# Patient Record
Sex: Male | Born: 1960 | Race: White | Hispanic: No | Marital: Married | Smoking: Never smoker
Health system: Southern US, Community
[De-identification: ages and names within clinical notes are randomized; demographics above are authoritative.]

## PROBLEM LIST (undated history)

## (undated) DIAGNOSIS — M810 Age-related osteoporosis without current pathological fracture: Secondary | ICD-10-CM

## (undated) DIAGNOSIS — F32A Depression, unspecified: Secondary | ICD-10-CM

## (undated) DIAGNOSIS — R55 Syncope and collapse: Secondary | ICD-10-CM

## (undated) DIAGNOSIS — H409 Unspecified glaucoma: Secondary | ICD-10-CM

## (undated) DIAGNOSIS — F329 Major depressive disorder, single episode, unspecified: Secondary | ICD-10-CM

## (undated) DIAGNOSIS — G4733 Obstructive sleep apnea (adult) (pediatric): Secondary | ICD-10-CM

## (undated) DIAGNOSIS — G4730 Sleep apnea, unspecified: Secondary | ICD-10-CM

## (undated) DIAGNOSIS — I10 Essential (primary) hypertension: Secondary | ICD-10-CM

## (undated) DIAGNOSIS — N189 Chronic kidney disease, unspecified: Secondary | ICD-10-CM

## (undated) DIAGNOSIS — R112 Nausea with vomiting, unspecified: Secondary | ICD-10-CM

## (undated) DIAGNOSIS — Z9989 Dependence on other enabling machines and devices: Principal | ICD-10-CM

## (undated) DIAGNOSIS — C801 Malignant (primary) neoplasm, unspecified: Secondary | ICD-10-CM

## (undated) DIAGNOSIS — E785 Hyperlipidemia, unspecified: Secondary | ICD-10-CM

## (undated) DIAGNOSIS — E119 Type 2 diabetes mellitus without complications: Secondary | ICD-10-CM

## (undated) DIAGNOSIS — H3500 Unspecified background retinopathy: Secondary | ICD-10-CM

## (undated) DIAGNOSIS — Z9889 Other specified postprocedural states: Secondary | ICD-10-CM

## (undated) DIAGNOSIS — T7840XA Allergy, unspecified, initial encounter: Secondary | ICD-10-CM

## (undated) HISTORY — DX: Major depressive disorder, single episode, unspecified: F32.9

## (undated) HISTORY — PX: VITRECTOMY: SHX106

## (undated) HISTORY — DX: Type 2 diabetes mellitus without complications: E11.9

## (undated) HISTORY — PX: POLYPECTOMY: SHX149

## (undated) HISTORY — DX: Age-related osteoporosis without current pathological fracture: M81.0

## (undated) HISTORY — DX: Sleep apnea, unspecified: G47.30

## (undated) HISTORY — DX: Unspecified glaucoma: H40.9

## (undated) HISTORY — DX: Depression, unspecified: F32.A

## (undated) HISTORY — DX: Malignant (primary) neoplasm, unspecified: C80.1

## (undated) HISTORY — PX: HAND SURGERY: SHX662

## (undated) HISTORY — PX: EYE SURGERY: SHX253

## (undated) HISTORY — DX: Allergy, unspecified, initial encounter: T78.40XA

## (undated) HISTORY — DX: Chronic kidney disease, unspecified: N18.9

## (undated) HISTORY — DX: Hyperlipidemia, unspecified: E78.5

## (undated) HISTORY — DX: Dependence on other enabling machines and devices: Z99.89

## (undated) HISTORY — DX: Obstructive sleep apnea (adult) (pediatric): G47.33

## (undated) HISTORY — PX: COLONOSCOPY: SHX174

## (undated) HISTORY — PX: OTHER SURGICAL HISTORY: SHX169

---

## 1992-10-04 HISTORY — PX: CATARACT EXTRACTION: SUR2

## 1992-10-04 HISTORY — PX: DG BARIUM SWALLOW (ARMC HX): HXRAD1448

## 1998-04-11 ENCOUNTER — Emergency Department (HOSPITAL_COMMUNITY): Admission: EM | Admit: 1998-04-11 | Discharge: 1998-04-12 | Payer: Self-pay | Admitting: Emergency Medicine

## 1999-10-30 ENCOUNTER — Other Ambulatory Visit: Admission: RE | Admit: 1999-10-30 | Discharge: 1999-10-30 | Payer: Self-pay | Admitting: Urology

## 2004-02-10 ENCOUNTER — Ambulatory Visit (HOSPITAL_COMMUNITY): Admission: RE | Admit: 2004-02-10 | Discharge: 2004-02-10 | Payer: Self-pay | Admitting: Ophthalmology

## 2007-03-28 ENCOUNTER — Ambulatory Visit (HOSPITAL_COMMUNITY): Admission: RE | Admit: 2007-03-28 | Discharge: 2007-03-28 | Payer: Self-pay | Admitting: Otolaryngology

## 2008-07-05 IMAGING — CT CT ORBIT/TEMPORAL/IAC W/O CM
1 of 3 series · 9 of 30 positions shown, 12 images · IV contrast (agent unspecified)
Comparison: None.

CLINICAL DATA: Left ear infection.  Rule out osteomyelitis.  
 TEMPORAL BONES/ORBIT CT WITHOUT CONTRAST:
TECHNIQUE: Axial and coronal plane CT imaging of the petrous temporal bones was performed with thin-collimation image reconstruction.  No intravenous contrast was administered.

[Series 3: bilateral · axial · 0.37mm/px · z∈[-157,-100]mm · 9 of 119 slices shown, 12 images]
[im 12/119  brain]
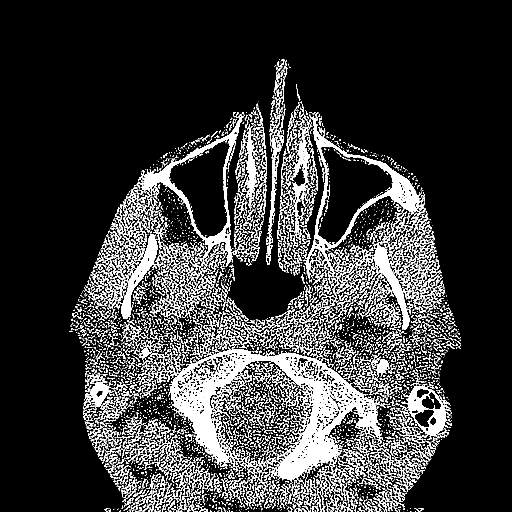
[im 12/119  bone]
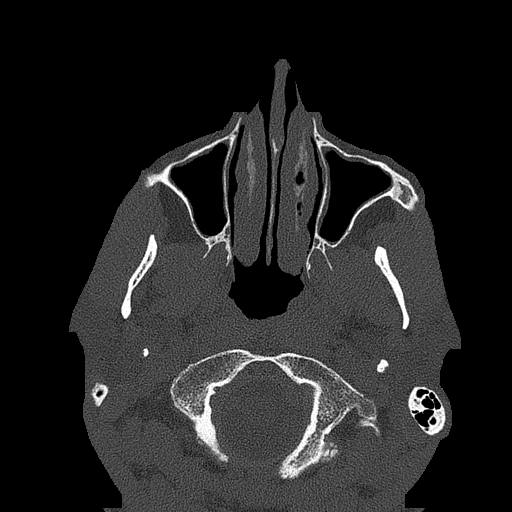
[im 24/119  bone]
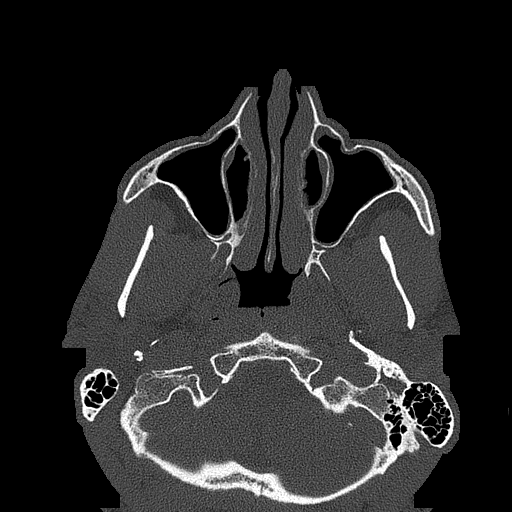
[im 36/119  bone]
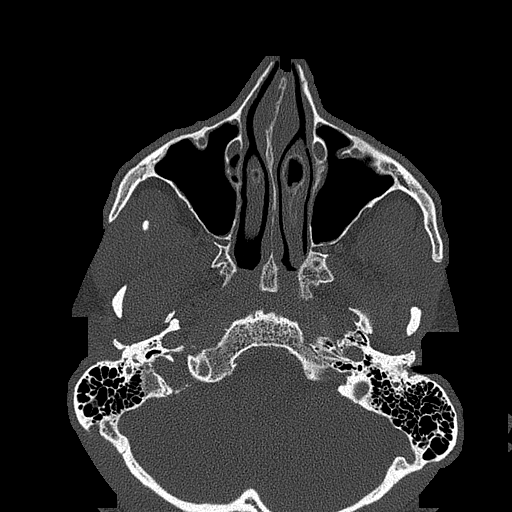
[im 48/119  bone]
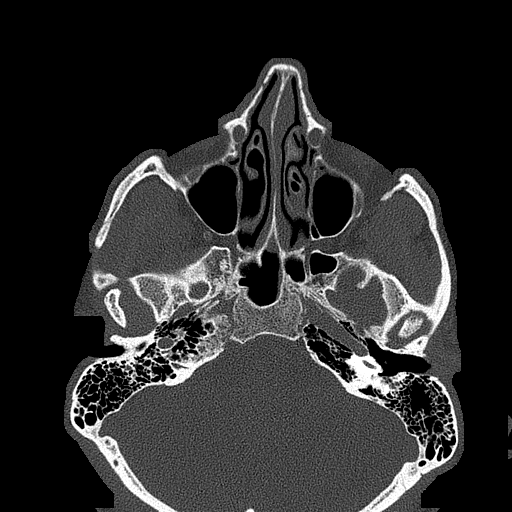
[im 60/119  brain]
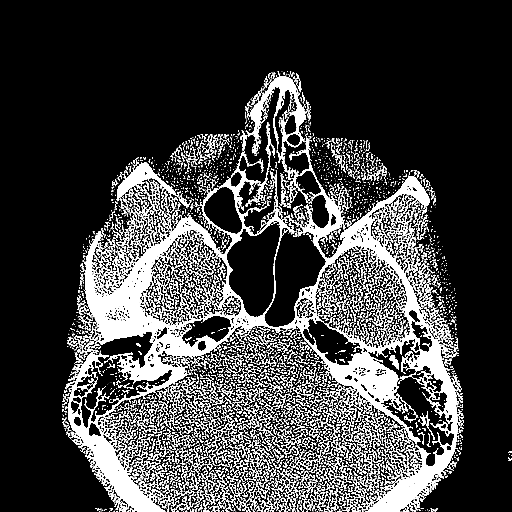
[im 60/119  bone]
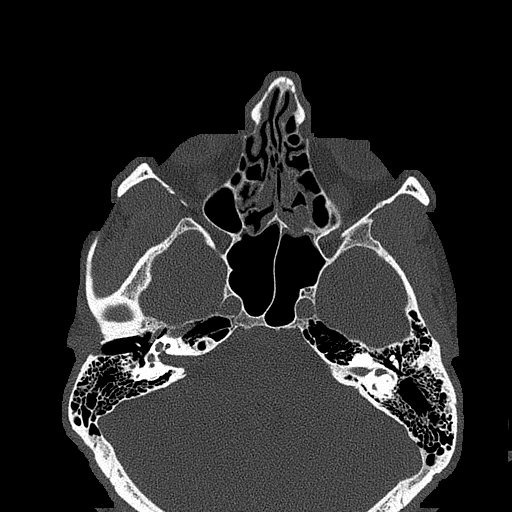
[im 71/119  bone]
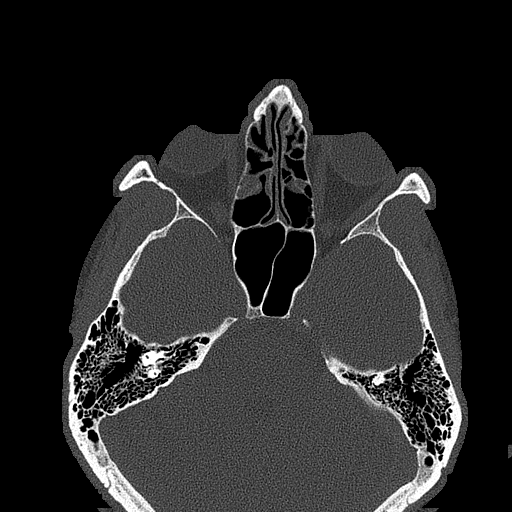
[im 83/119  bone]
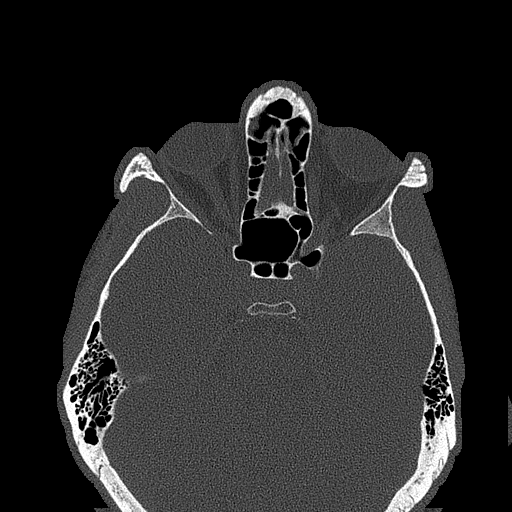
[im 95/119  bone]
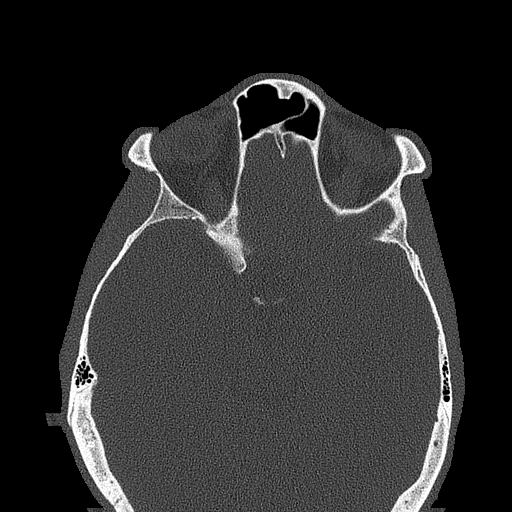
[im 107/119  brain]
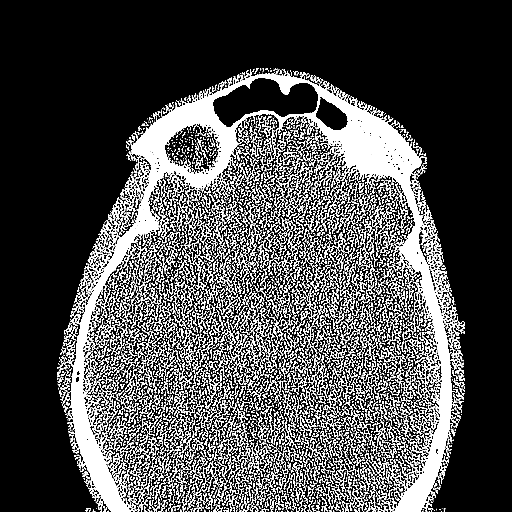
[im 107/119  bone]
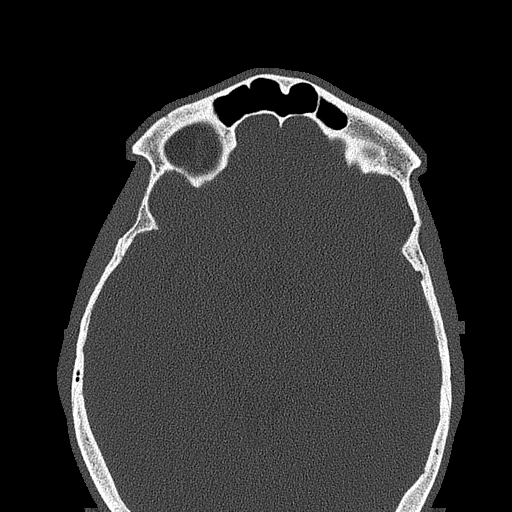

[9 of 30 positions shown; findings below may reference images not displayed]

FINDINGS: The temporal bones are normal bilaterally.  The middle ear is well aerated and the ossicles are normal.  The mastoid air cells are well developed and are clear.  There is no mass or cholesteatoma.  
 There is mild mucosal thickening in the paranasal sinuses without air fluid level.
IMPRESSION: 1. Normal temporal bones bilaterally. 
 2. Mild chronic sinusitis in the paranasal sinuses.

## 2012-10-13 ENCOUNTER — Encounter: Payer: Self-pay | Admitting: Internal Medicine

## 2012-10-30 ENCOUNTER — Ambulatory Visit (AMBULATORY_SURGERY_CENTER): Payer: BC Managed Care – PPO | Admitting: *Deleted

## 2012-10-30 ENCOUNTER — Telehealth: Payer: Self-pay | Admitting: *Deleted

## 2012-10-30 VITALS — Ht 71.0 in | Wt 188.6 lb

## 2012-10-30 DIAGNOSIS — Z1211 Encounter for screening for malignant neoplasm of colon: Secondary | ICD-10-CM

## 2012-10-30 MED ORDER — MOVIPREP 100 G PO SOLR: 1.0000 | Freq: Once | ORAL | Status: DC

## 2012-10-30 NOTE — Progress Notes (Signed)
Patient is on Insulin Pump, information sent to [facility], [place].

## 2012-10-30 NOTE — Telephone Encounter (Signed)
Patient is for direct screening colonoscopy and is on Insulin Pump managed by Dr.Aronson. Explained to patient that you will call him with insulin instructions. Thanks!

## 2012-10-31 ENCOUNTER — Encounter: Payer: Self-pay | Admitting: Internal Medicine

## 2012-10-31 NOTE — Telephone Encounter (Signed)
lvm for pt to call me back about his insulin pump. And instructions regarding his pump for his procedure.

## 2012-11-08 ENCOUNTER — Telehealth: Payer: Self-pay | Admitting: Gastroenterology

## 2012-11-08 ENCOUNTER — Other Ambulatory Visit: Payer: Self-pay | Admitting: Gastroenterology

## 2012-11-08 NOTE — Telephone Encounter (Signed)
Spoke to Diane Dr. Jacquiline Doe nurse, asked her for instructions for pt regarding his insulin pump for his procedure he has scheduled for next week.  She said she will call me back.

## 2012-11-08 NOTE — Telephone Encounter (Signed)
Dr. Jacquiline Doe office called back with insulin pump recommendations, I called pt to tell him per Dr. Reynaldo Minium: Back down on Basil's and No bolus' until he is back eating solid foods again. Pt. Stated understanding.

## 2012-11-15 ENCOUNTER — Other Ambulatory Visit: Payer: Self-pay | Admitting: Internal Medicine

## 2012-11-15 ENCOUNTER — Ambulatory Visit (AMBULATORY_SURGERY_CENTER): Payer: BC Managed Care – PPO | Admitting: Internal Medicine

## 2012-11-15 ENCOUNTER — Encounter: Payer: Self-pay | Admitting: Internal Medicine

## 2012-11-15 VITALS — BP 100/56 | HR 57 | Temp 96.0°F | Resp 19 | Ht 71.0 in | Wt 188.0 lb

## 2012-11-15 DIAGNOSIS — D126 Benign neoplasm of colon, unspecified: Secondary | ICD-10-CM

## 2012-11-15 DIAGNOSIS — Z1211 Encounter for screening for malignant neoplasm of colon: Secondary | ICD-10-CM

## 2012-11-15 MED ORDER — DEXTROSE 5 % IV SOLN: INTRAVENOUS | Status: DC

## 2012-11-15 NOTE — Progress Notes (Signed)
Pt stable to RR 

## 2012-11-15 NOTE — Op Note (Addendum)
[facility]  [address] [place], [address]   COLONOSCOPY PROCEDURE REPORT  PATIENT: Dillon Lane, Dillon Lane.  MR#: [id number] BIRTHDATE: [birth year] , 51  yrs. old GENDER: Male ENDOSCOPIST: Jerene Bears, MD REFERRED [id number], Richard PROCEDURE DATE:  11/15/2012 PROCEDURE:   Colonoscopy with snare polypectomy ASA CLASS:   Class II INDICATIONS:average risk screening and first colonoscopy. MEDICATIONS: MAC sedation, administered by CRNA and propofol (Diprivan) 200mg  IV  DESCRIPTION OF PROCEDURE:   After the risks benefits and alternatives of the procedure were thoroughly explained, informed consent was obtained.  A digital rectal exam revealed no rectal mass.   The LB [id number]-[id number]  endoscope was introduced through the anus and advanced to the cecum, which was identified by both the appendix and ileocecal valve. No adverse events experienced. The quality of the prep was good, using MoviPrep  The instrument was then slowly withdrawn as the colon was fully examined.   COLON FINDINGS: Two sessile polyps measuring 4-5 mm in size were found in the transverse colon and sigmoid colon.  Polypectomy was performed using cold snare.  All resections were complete and all polyp tissue was completely retrieved.   Small internal hemorrhoids were found.  Retroflexed views revealed internal hemorrhoids. The time to cecum=3 minutes 11 seconds.  Withdrawal time=14 minutes 04 seconds.  The scope was withdrawn and the procedure completed. COMPLICATIONS: There were no complications.  ENDOSCOPIC IMPRESSION: 1.   Two sessile polyps measuring 4-5 mm in size were found in the transverse colon and sigmoid colon; Polypectomy was performed using cold snare 2.   Small internal hemorrhoids  RECOMMENDATIONS: 1.  Hold aspirin, aspirin products, and anti-inflammatory medication for 1 week. 2.  Await pathology results 3.  If the polyps removed today are proven to be  adenomatous (pre-cancerous) polyps, you will need a repeat colonoscopy in 5 years.  Otherwise you should continue to follow colorectal cancer screening guidelines for "routine risk" patients with colonoscopy in 10 years.  You will receive a letter within 1-2 weeks with the results of your biopsy as well as final recommendations.  Please call my office if you have not received a letter after 3 weeks.   eSigned:  Jerene Bears, MD 11/15/2012 10:25 AM  cc: The Patient and Burnard Bunting, MD

## 2012-11-15 NOTE — Progress Notes (Signed)
Called to room to assist during endoscopic procedure.  Patient ID and intended procedure confirmed with present staff. Received instructions for my participation in the procedure from the performing physician.  

## 2012-11-15 NOTE — Patient Instructions (Addendum)
Findings:  Polyps, Small Internal Hemorrhoids Recommendations:  HOLD ASPIRIN, ASPIRIN PRODUCTS, AND ANTI-INFLAMMATORY MEDICATIONS FOR ONE WEEK.   YOU HAD AN ENDOSCOPIC PROCEDURE TODAY AT [facility]: Refer to the procedure report that was given to you for any specific questions about what was found during the examination.  If the procedure report does not answer your questions, please call your gastroenterologist to clarify.  If you requested that your care partner not be given the details of your procedure findings, then the procedure report has been included in a sealed envelope for you to review at your convenience later.  YOU SHOULD EXPECT: Some feelings of bloating in the abdomen. Passage of more gas than usual.  Walking can help get rid of the air that was put into your GI tract during the procedure and reduce the bloating. If you had a lower endoscopy (such as a colonoscopy or flexible sigmoidoscopy) you may notice spotting of blood in your stool or on the toilet paper. If you underwent a bowel prep for your procedure, then you may not have a normal bowel movement for a few days.  DIET: Your first meal following the procedure should be a light meal and then it is ok to progress to your normal diet.  A half-sandwich or bowl of soup is an example of a good first meal.  Heavy or fried foods are harder to digest and may make you feel nauseous or bloated.  Likewise meals heavy in dairy and vegetables can cause extra gas to form and this can also increase the bloating.  Drink plenty of fluids but you should avoid alcoholic beverages for 24 hours.  ACTIVITY: Your care partner should take you home directly after the procedure.  You should plan to take it easy, moving slowly for the rest of the day.  You can resume normal activity the day after the procedure however you should NOT DRIVE or use heavy machinery for 24 hours (because of the sedation medicines used during the test).     SYMPTOMS TO REPORT IMMEDIATELY: A gastroenterologist can be reached at any hour.  During normal business hours, 8:30 AM to 5:00 PM Monday through Friday, call [phone].  After hours and on weekends, please call the GI answering service at [phone] who will take a message and have the physician on call contact you.   Following lower endoscopy (colonoscopy or flexible sigmoidoscopy):  Excessive amounts of blood in the stool  Significant tenderness or worsening of abdominal pains  Swelling of the abdomen that is new, acute  Fever of 100F or higher  Following upper endoscopy (EGD)  Vomiting of blood or coffee ground material  New chest pain or pain under the shoulder blades  Painful or persistently difficult swallowing  New shortness of breath  Fever of 100F or higher  Black, tarry-looking stools  FOLLOW UP: If any biopsies were taken you will be contacted by phone or by letter within the next 1-3 weeks.  Call your gastroenterologist if you have not heard about the biopsies in 3 weeks.  Our staff will call the home number listed on your records the next business day following your procedure to check on you and address any questions or concerns that you may have at that time regarding the information given to you following your procedure. This is a courtesy call and so if there is no answer at the home number and we have not heard from you through the emergency physician on call,  we will assume that you have returned to your regular daily activities without incident.  SIGNATURES/CONFIDENTIALITY: You and/or your care partner have signed paperwork which will be entered into your electronic medical record.  These signatures attest to the fact that that the information above on your After Visit Summary has been reviewed and is understood.  Full responsibility of the confidentiality of this discharge information lies with you and/or your care-partner.  Please follow all discharge  instructions given to you by the recovery room nurse. If you have any questions or problems after discharge please call one of the numbers listed above. You will receive a phone call in the am to see how you are doing and answer any questions you may have. Thank you for choosing [facility] for your health care needs.

## 2012-11-20 ENCOUNTER — Telehealth: Payer: Self-pay | Admitting: *Deleted

## 2012-11-20 NOTE — Telephone Encounter (Signed)
  Follow up Call-  Call back number 11/15/2012  Post procedure Call Back phone  # [phone]  Permission to leave phone message Yes     Patient questions:  Do you have a fever, pain , or abdominal swelling? no Pain Score  0 *  Have you tolerated food without any problems? yes  Have you been able to return to your normal activities? yes  Do you have any questions about your discharge instructions: Diet  yes Medications  no Follow up visit  no  Do you have questions or concerns about your Care? no  Actions: * If pain score is 4 or above: No action needed, pain <4. Pt. States he has lots of loose stools.  He wondered about adding fiber to his diet.  Advised to start slowly and aim for 35-45 grams daily.  He was also encouraged to drink fluids to flush  Bowel.

## 2012-11-21 ENCOUNTER — Encounter: Payer: Self-pay | Admitting: Internal Medicine

## 2016-07-07 ENCOUNTER — Ambulatory Visit (INDEPENDENT_AMBULATORY_CARE_PROVIDER_SITE_OTHER): Payer: Medicare Other | Admitting: Neurology

## 2016-07-07 ENCOUNTER — Encounter: Payer: Self-pay | Admitting: Neurology

## 2016-07-07 VITALS — BP 142/70 | HR 72 | Resp 16 | Ht 72.0 in | Wt 188.0 lb

## 2016-07-07 DIAGNOSIS — G4719 Other hypersomnia: Secondary | ICD-10-CM | POA: Diagnosis not present

## 2016-07-07 DIAGNOSIS — R351 Nocturia: Secondary | ICD-10-CM | POA: Diagnosis not present

## 2016-07-07 DIAGNOSIS — R0683 Snoring: Secondary | ICD-10-CM

## 2016-07-07 DIAGNOSIS — R0681 Apnea, not elsewhere classified: Secondary | ICD-10-CM

## 2016-07-07 NOTE — Progress Notes (Signed)
Subjective:    Patient ID: Dillon Lane is a 55 y.o. male.  HPI     Star Age, MD, PhD [facility] [address], [address] [place], [place] [address]  Dear Dr. Reynaldo Minium,   I saw your patient, Dillon Lane, upon your kind request in my neurologic clinic today for initial consultation of his sleep disorder, in particular, concern for underlying obstructive sleep apnea. The patient is accompanied by his wife today. As you know, [name] is a 55 year old right-handed gentleman with an underlying medical history of type 1 diabetes (diagnosed at 35) with diabetic retinopathy, nephropathy and neuropathy, history of Sz in the context of hypoglycemia in 8/16, history of IBS, hearing loss, hypertension, open angle glaucoma, and overweight state, who reports snoring and excessive daytime somnolence. His wife has occasionally noted pauses in his breathing while he is asleep. I reviewed your office note from 07/01/2016, which you kindly included. He endorses recent stressors. His wife retired and since then he has been on [employer], but found out recently that a lot of his diabetes supplies are not covered on his new insurance. This has caused him stress and anxiety. He also endorses depression. He has recently seen a counselor, which he found helpful. He is on disability. He does not smoke or drink alcohol but drinks unsweetened iced tea him a several cups per day, about 3-4 at least. Bedtime varies. He admits to not having a set routine. He is typically in bed after 11:30 PM. He does not typically nap. He has some trouble going to sleep and staying asleep. His mind is racing at times. He does get sleepy when sedentary. Epworth sleepiness score is 9 out of 24 today, fatigue score is 30 out of 63. He does not have a family history of obstructive sleep apnea. He denies restless leg symptoms.  His Past Medical History Is Significant For: Past Medical History:  Diagnosis Date   . Chronic kidney disease    stage 2  . Diabetes mellitus without complication ([place])    DM type 1, on insulin pump.  . Glaucoma     His Past Surgical History Is Significant For: Past Surgical History:  Procedure Laterality Date  . CATARACT EXTRACTION  1994   left eye  . EYE SURGERY     x3 with 9 sessions of lasers for retinopathy.  [name] HAND SURGERY  2 years ago   x2    His Family History Is Significant For: Family History  Problem Relation Age of Onset  . Brain cancer Mother   . Prostate cancer Father   . Colon cancer Neg Hx   . Colon polyps Neg Hx   . Rectal cancer Neg Hx   . Stomach cancer Neg Hx     His Social History Is Significant For: Social History   Social History  . Marital status: Married    Spouse name: N/A  . Number of children: 2  . Years of education: HS   Occupational History  . Disability     Social History Main Topics  . Smoking status: Never Smoker  . Smokeless tobacco: Never Used  . Alcohol use No  . Drug use: No  . Sexual activity: Not Asked   Other Topics Concern  . None   Social History Narrative   Denies caffeine use     His Allergies Are:  Allergies  Allergen Reactions  . Tetracyclines & Related Shortness Of Breath  :   His  Current Medications Are:  Outpatient Encounter Prescriptions as of 07/07/2016  Medication Sig  . bimatoprost (LUMIGAN) 0.01 % SOLN Place 1 drop into both eyes at bedtime.  . cetirizine (ZYRTEC) 10 MG tablet Take 10 mg by mouth daily.  . Cholecalciferol (VITAMIN D3) 1000 UNITS CAPS Take 1 capsule by mouth daily.  . dorzolamide (TRUSOPT) 2 % ophthalmic solution Place 1 drop into both eyes 2 (two) times daily.  [name] HUMALOG 100 UNIT/ML injection USE AS DIRECTED IN INSULIN PUMP, MAX 66 UNITS/DAY  . lisinopril-hydrochlorothiazide (PRINZIDE,ZESTORETIC) 20-12.5 MG per tablet Take 1 tablet by mouth 2 (two) times daily.  . timolol (TIMOPTIC) 0.5 % ophthalmic solution INSTILL 1 DROP INTO BOTH EYES 2 TIMES DAILY.  .  [DISCONTINUED] brimonidine-timolol (COMBIGAN) 0.2-0.5 % ophthalmic solution Place 1 drop into the right eye every 12 (twelve) hours.  . [DISCONTINUED] cefdinir (OMNICEF) 300 MG capsule Take 300 mg by mouth 2 (two) times daily.  . [DISCONTINUED] Insulin Human (INSULIN PUMP) 100 unit/ml SOLN Inject 1 each into the skin continuous.  . [DISCONTINUED] Loperamide HCl (IMODIUM A-D PO) Take 1 mg by mouth as needed.   No facility-administered encounter medications on file as of 07/07/2016.   :  Review of Systems:  Out of a complete 14 point review of systems, all are reviewed and negative with the exception of these symptoms as listed below: Review of Systems  Allergic/Immunologic: Positive for environmental allergies.  Neurological:       Patient feels that he is a light sleeper, snoring, witnessed apnea, wakes up feeling tired, may fall asleep when sitting still, denies taking naps.  Patient reports feeling depressed currently.   Psychiatric/Behavioral:       Disinterest in activities     Epworth Sleepiness Scale 0= would never doze 1= slight chance of dozing 2= moderate chance of dozing 3= high chance of dozing  Sitting and reading:2 Watching TV:2 Sitting inactive in a public place (ex. Theater or meeting):2 As a passenger in a car for an hour without a break:2 Lying down to rest in the afternoon:0 Sitting and talking to someone:1 Sitting quietly after lunch (no alcohol):0 In a car, while stopped in traffic:0 Total:9  Objective:  Neurologic Exam  Physical Exam Physical Examination:   Vitals:   07/07/16 1559  BP: (!) 142/70  Pulse: 72  Resp: 16    General Examination: The patient is a very pleasant 55 y.o. male in no acute distress. He appears well-developed and well-nourished and well groomed.   HEENT: Normocephalic, atraumatic, pupils are unequal, round and sluggish reactive to light and accommodation. He is status post vitrectomy 3 he states. He has had cataract repairs  bilaterally and is wearing a contact lens on the right eye. Funduscopic exam with sharp disc margins noted. Extraocular tracking is good without limitation to gaze excursion or nystagmus noted. Normal smooth pursuit is noted. Hearing is grossly intact. Face is symmetric with normal facial animation and normal facial sensation. Speech is clear with no dysarthria noted. There is no hypophonia. There is no lip, neck/head, jaw or voice tremor. Neck is supple with full range of passive and active motion. There are no carotid bruits on auscultation. Oropharynx exam reveals: mild mouth dryness, adequate dental hygiene and moderate airway crowding, due to larger tonsils of 2-3+ bilaterally, larger uvula, smaller airway entry. Mallampati is class II. Neck circumference is 16-3/8 in.    Chest: Clear to auscultation without wheezing, rhonchi or crackles noted.  Heart: S1+S2+0, regular and normal without murmurs, rubs  or gallops noted.   Abdomen: Soft, non-tender and non-distended with normal bowel sounds appreciated on auscultation. Glucose monitor and pump in place.  Extremities: There is no pitting edema in the distal lower extremities bilaterally. Pedal pulses are intact.  Skin: Warm and dry without trophic changes noted. There are no varicose veins.  Musculoskeletal: exam reveals no obvious joint deformities, tenderness or joint swelling or erythema.   Neurologically:  Mental status: The patient is awake, alert and oriented in all 4 spheres. His immediate and remote memory, attention, language skills and fund of knowledge are appropriate. There is no evidence of aphasia, agnosia, apraxia or anomia. Speech is clear with normal prosody and enunciation. Thought process is linear. Mood is normal and affect is blunted.  Cranial nerves II - XII are as described above under HEENT exam. In addition: shoulder shrug is normal with equal shoulder height noted. Motor exam: Normal bulk, strength and tone is noted.  There is no drift, tremor or rebound. Romberg is negative. Reflexes are 1+ in the upper extremities and diminished in the lower extremities. Babinski: Toes are flexor bilaterally. Fine motor skills and coordination: intact with normal finger taps, normal hand movements, normal rapid alternating patting, normal foot taps and normal foot agility.  Cerebellar testing: No dysmetria or intention tremor on finger to nose testing. Heel to shin is unremarkable bilaterally. There is no truncal or gait ataxia.  Sensory exam: intact to light touch, pinprick, vibration, temperature sense in the upper extremities, but decrease to all modalities in the distal lower extremities, up to above ankles bilaterally.   Gait, station and balance: He stands easily. No veering to one side is noted. No leaning to one side is noted. Posture is age-appropriate and stance is narrow based. Gait shows normal stride length and normal pace. No problems turning are noted.                Assessment and Plan:   In summary, EFOSA TREICHLER is a very pleasant 55 y.o.-year old male with an underlying medical history of type 1 diabetes (diagnosed at 55) with diabetic retinopathy, nephropathy and neuropathy, history of Sz in the context of hypoglycemia in 8/16, history of IBS, hearing loss, hypertension, open angle glaucoma, and overweight state, whose history and physical exam are concerning for obstructive sleep apnea (OSA). I had a long chat with the patient and his wife about my findings and the diagnosis of OSA, its prognosis and treatment options. We talked about medical treatments, surgical interventions and non-pharmacological approaches. I explained in particular the risks and ramifications of untreated moderate to severe OSA, especially with respect to developing cardiovascular disease down the Road, including congestive heart failure, difficult to treat hypertension, cardiac arrhythmias, or stroke. Even type 2 diabetes has, in part, been  linked to untreated OSA. Symptoms of untreated OSA include daytime sleepiness, memory problems, mood irritability and mood disorder such as depression and anxiety, lack of energy, as well as recurrent headaches, especially morning headaches. We talked about trying to maintain a healthy lifestyle in general, as well as the importance of weight control. I encouraged the patient to eat healthy, exercise daily and keep well hydrated, to keep a scheduled bedtime and wake time routine, to not skip any meals and eat healthy snacks in between meals. I advised the patient not to drive when feeling sleepy. He is advised to gradually reduce his T intake and increase his water intake. He is also advised to avoid caffeine after 5 PM, better  3 PM. I recommended the following at this time: sleep study with potential positive airway pressure titration. (We will score hypopneas at 4% and split the sleep study into diagnostic and treatment portion, if the estimated. 2 hour AHI is >20/h).   I explained the sleep test procedure to the patient and also outlined possible surgical and non-surgical treatment options of OSA, including the use of a custom-made dental device (which would require a referral to a specialist dentist or oral surgeon), upper airway surgical options, such as pillar implants, radiofrequency surgery, tongue base surgery, and UPPP (which would involve a referral to an ENT surgeon). Rarely, jaw surgery such as mandibular advancement may be considered.  I also explained the CPAP treatment option to the patient, who indicated that he would be willing to try CPAP if the need arises. I explained the importance of being compliant with PAP treatment, not only for insurance purposes but primarily to improve His symptoms, and for the patient's long term health benefit, including to reduce His cardiovascular risks. I answered all their questions today and the patient and his wife were in agreement. I would like to see him  back after the sleep study is completed and encouraged him to call with any interim questions, concerns, problems or updates.   Thank you very much for allowing me to participate in the care of this nice patient. If I can be of any further assistance to you please do not hesitate to call me at [phone].  Sincerely,   Star Age, MD, PhD

## 2016-07-07 NOTE — Patient Instructions (Addendum)
Based on your symptoms and your exam I believe you are at risk for obstructive sleep apnea or OSA, and I think we should proceed with a sleep study to determine whether you do or do not have OSA and how severe it is. If you have more than mild OSA, I want you to consider treatment with CPAP. Please remember, the risks and ramifications of moderate to severe obstructive sleep apnea or OSA are: Cardiovascular disease, including congestive heart failure, stroke, difficult to control hypertension, arrhythmias, and even type 2 diabetes has been linked to untreated OSA. Sleep apnea causes disruption of sleep and sleep deprivation in most cases, which, in turn, can cause recurrent headaches, problems with memory, mood, concentration, focus, and vigilance. Most people with untreated sleep apnea report excessive daytime sleepiness, which can affect their ability to drive. Please do not drive if you feel sleepy.   I will likely see you back after your sleep study to go over the test results and where to go from there. We will call you after your sleep study to advise about the results (most likely, you will hear from Beverlee Nims, my nurse) and to set up an appointment at the time, as necessary.    Our sleep lab administrative assistant, Arrie Aran will meet with you or call you to schedule your sleep study. If you don't hear back from her by next week please feel free to call her at [phone]. This is her direct line and please leave a message with your phone number to call back if you get the voicemail box. She will call back as soon as possible.   Please reduce your tea consumption, avoid caffeine after 3 PM, at least after 5 PM.

## 2016-09-06 ENCOUNTER — Ambulatory Visit (INDEPENDENT_AMBULATORY_CARE_PROVIDER_SITE_OTHER): Payer: Medicare Other | Admitting: Neurology

## 2016-09-06 DIAGNOSIS — G4733 Obstructive sleep apnea (adult) (pediatric): Secondary | ICD-10-CM | POA: Diagnosis not present

## 2016-09-06 DIAGNOSIS — G4720 Circadian rhythm sleep disorder, unspecified type: Secondary | ICD-10-CM

## 2016-09-15 ENCOUNTER — Telehealth: Payer: Self-pay

## 2016-09-15 NOTE — Progress Notes (Signed)
Diana:  Patient referred by Dr. Reynaldo Minium, seen by me on 07/07/16, split study on 09/06/16. Please call and notify patient that the recent sleep study confirmed the diagnosis of severe OSA. He did very well with CPAP during the study with significant improvement of the respiratory events. Therefore, I would like start the patient on CPAP therapy at home by prescribing a machine for home use. I placed the order in the chart. The patient will need a follow up appointment with me in 8 to 10 weeks post set up that has to be scheduled; please go ahead and schedule while you have the patient on the phone and make sure patient understands the importance of keeping this window for the FU appointment, as it is often an insurance requirement and failing to adhere to this may result in losing coverage for sleep apnea treatment.  Please re-enforce the importance of compliance with treatment and the need for [place] to monitor compliance data - again an insurance requirement and good feedback for the patient as far as how they are doing.  Also remind patient, that any upcoming CPAP machine or mask issues, should be first addressed with the DME company. Please ask if patient has a preference regarding DME company.  Please arrange for CPAP set up at home through a DME company of patient's choice - once you have spoken to the patient - and faxed/routed report to PCP and referring MD (if other than PCP), you can close this encounter, thanks,   Star Age, MD, PhD [facility] ([place])

## 2016-09-15 NOTE — Addendum Note (Signed)
Addended by: Star Age on: 09/15/2016 08:13 AM   Modules accepted: Orders

## 2016-09-15 NOTE — Telephone Encounter (Signed)
I had an extended conversation with pt regarding his sleep study results. I explained that the pt has severe sleep apnea and Dr. Rexene Alberts recommends starting a cpap. Pt had many questions regarding untreated sleep apnea risks, the insurance process for getting a cpap, cpap requirements, etc. I answered all of his questions. I offered pt a follow up appt with Dr. Rexene Alberts to discuss further but pt declined. Pt says that he promises to call me back next week to discuss. He wants to do his research and discuss it with his wife. Pt asked me what days that I am working next week and says he will call me. I advised pt that I would call him next Thursday if I have not heard from him by then. Pt verbalized understanding of results.

## 2016-09-15 NOTE — Telephone Encounter (Signed)
-----   Message from Star Age, MD sent at 09/15/2016  8:13 AM EST ----- Beverlee Nims:  Patient referred by Dr. Reynaldo Minium, seen by me on 07/07/16, split study on 09/06/16. Please call and notify patient that the recent sleep study confirmed the diagnosis of severe OSA. He did very well with CPAP during the study with significant improvement of the respiratory events. Therefore, I would like start the patient on CPAP therapy at home by prescribing a machine for home use. I placed the order in the chart. The patient will need a follow up appointment with me in 8 to 10 weeks post set up that has to be scheduled; please go ahead and schedule while you have the patient on the phone and make sure patient understands the importance of keeping this window for the FU appointment, as it is often an insurance requirement and failing to adhere to this may result in losing coverage for sleep apnea treatment.  Please re-enforce the importance of compliance with treatment and the need for [place] to monitor compliance data - again an insurance requirement and good feedback for the patient as far as how they are doing.  Also remind patient, that any upcoming CPAP machine or mask issues, should be first addressed with the DME company. Please ask if patient has a preference regarding DME company.  Please arrange for CPAP set up at home through a DME company of patient's choice - once you have spoken to the patient - and faxed/routed report to PCP and referring MD (if other than PCP), you can close this encounter, thanks,   Star Age, MD, PhD [facility] ([place])

## 2016-09-15 NOTE — Procedures (Signed)
PATIENT'S NAME:  Dillon Lane, Dillon Lane DOB:      October 13, 1960      MR#:    [id number]     DATE OF RECORDING: 09/06/2016 REFERRING M.D.:  Burnard Bunting, MD Study Performed:  Split-Night Titration Study HISTORY: 55 year old man with a history of type 1 diabetes, diabetic retinopathy, nephropathy and neuropathy, history of Sz in the context of hypoglycemia, IBS, hearing loss, hypertension, open angle glaucoma, and overweight state, who reports snoring and excessive daytime somnolence. His wife has occasionally noted pauses in his breathing while he is asleep. The patient endorsed the Epworth Sleepiness Scale at 9/24 points. The patient's weight 188 pounds with a height of 72 (inches), resulting in a BMI of 25.4 kg/m2. The patient's neck circumference measured 16 inches.  CURRENT MEDICATIONS: Lumigan, Zyrtec, Vitamin D, Trusopt, Humalog, Prinizide, Timoptic.    PROCEDURE:  This is a multichannel digital polysomnogram utilizing the Somnostar 11.2 system.  Electrodes and sensors were applied and monitored per AASM Specifications.   EEG, EOG, Chin and Limb EMG, were sampled at 200 Hz.  ECG, Snore and Nasal Pressure, Thermal Airflow, Respiratory Effort, CPAP Flow and Pressure, Oximetry was sampled at 50 Hz. Digital video and audio were recorded.      BASELINE STUDY WITHOUT CPAP RESULTS:  Lights Out was at 22:10 and Lights On at 05:20 for the night.  Total recording time (TRT) was 164.5, with a total sleep time (TST) of 146.5 minutes.   The patient's sleep latency was 11 minutes.  REM latency was 0 minutes.  The sleep efficiency was 89.1 %.    SLEEP ARCHITECTURE: WASO (Wake after sleep onset) was 9 minutes, Stage N1 was 13 minutes, Stage N2 was 133.5 minutes, Stage N3 was 0 minutes and Stage R (REM sleep) was 0 minutes.  The percentages were Stage N1 8.9%, Stage N2 91.1%, Stage N3 and Stage R were absent.   Audio and video analysis did not show any abnormal or unusual movements, behaviors, phonations or  vocalizations.  The patient took 1 bathroom break. Moderate to loud snoring was noted. The EKG was in keeping with normal sinus rhythm (NSR)  RESPIRATORY ANALYSIS:  There were a total of 111 respiratory events:  85 obstructive apneas, 0 central apneas and 0 mixed apneas with a total of 85 apneas and an apnea index (AI) of 34.8. There were 26 hypopneas with a hypopnea index of 10.6. The patient also had 0 respiratory event related arousals (RERAs).  Snoring was noted.     The total APNEA/HYPOPNEA INDEX (AHI) was 45.5 /hour and the total RESPIRATORY DISTURBANCE INDEX was 45.5 /hour.  0 events occurred in REM sleep and 52 events in NREM. The REM AHI was 0, /hour versus a non-REM AHI of 45.5 /hour. The patient spent 401.5 minutes sleep time in the supine position 0 minutes in non-supine. The supine AHI was 45.4 /hour versus a non-supine AHI of 0.0 /hour.  OXYGEN SATURATION & C02:  The wake baseline 02 saturation was 98%, with the lowest being 88%. Time spent below 89% saturation equaled 0 minutes.   PERIODIC LIMB MOVEMENTS:    The patient had a total of 0 Periodic Limb Movements.  The Periodic Limb Movement (PLM) index was 0 /hour and the PLM Arousal index was 0 /hour.   TITRATION STUDY WITH CPAP RESULTS:   Patient was fitted with a medium Simplus FFM, CPAP was initiated at 5 cmH20 with heated humidity per AASM split night standards and pressure was advanced to 14 cmH20 because  of hypopneas, apneas and desaturations.  At a PAP pressure of 14 cmH20, there was a reduction of the AHI to 0 per hour and O2 nadir was 98%, supine REM sleep was achieved on a pressure of 12 and 13 cm.   Total recording time (TRT) was 266 minutes, with a total sleep time (TST) of 255 minutes. The patient's sleep latency was 3.5 minutes. REM latency was 93 minutes.  The sleep efficiency was 95.9 %.    SLEEP ARCHITECTURE: Wake after sleep was 7.5 minutes, Stage N1 10 minutes, Stage N2 121 minutes, Stage N3 78.5 minutes and Stage  R (REM sleep) 45.5 minutes. The percentages were: Stage N1 3.9%, Stage N2 47.5%, Stage N3 30.8%, which was increased, likely due to rebound, and Stage R (REM sleep) was 17.8%.   RESPIRATORY ANALYSIS:  There were a total of 44 respiratory events: 40 obstructive apneas, 0 central apneas and 0 mixed apneas with a total of 40 apneas and an apnea index (AI) of 9.4. There were 4 hypopneas with a hypopnea index of .9 /hour. The patient also had 0 respiratory event related arousals (RERAs).      The total APNEA/HYPOPNEA INDEX  (AHI) was 10.4 /hour and the total RESPIRATORY DISTURBANCE INDEX was 10.4 /hour.  5 events occurred in REM sleep and 39 events in NREM. The REM AHI was 6.6 /hour versus a non-REM AHI of 11.2 /hour. REM sleep was achieved on a pressure of  cm/h2o (AHI was  .) The patient spent 100% of total sleep time in the supine position. The supine AHI was 10.3 /hour, versus a non-supine AHI of 0.0/hour.  OXYGEN SATURATION & C02:  The wake baseline 02 saturation was 98%, with the lowest being 94%. Time spent below 89% saturation equaled 0 minutes.  PERIODIC LIMB MOVEMENTS:    The patient had a total of 0 Periodic Limb Movements. The Periodic Limb Movement (PLM) index was 0 /hour and the PLM Arousal index was 0 /hour.   Post-study, the patient indicated that sleep was the same as usual.    POLYSOMNOGRAPHY IMPRESSION :   1. Obstructive Sleep Apnea (OSA)  2. Dysfunctions associated with sleep stages or arousals from sleep    RECOMMENDATIONS:  1. This patient has severe obstructive sleep apnea and responded well on CPAP therapy. Of note, the absence of REM sleep during the baseline portion of the study likely underestimates the AHI and O2 nadir. I will, therefore, start the patient on home CPAP treatment at a pressure of 14 cm via medium FFM, with heated humidity. The patient should be reminded to be fully compliant with PAP therapy to improve sleep related symptoms and decrease long term  cardiovascular risks. Please note that untreated obstructive sleep apnea carries additional perioperative morbidity. Patients with significant obstructive sleep apnea should receive perioperative PAP therapy and the surgeons and particularly the anesthesiologist should be informed of the diagnosis and the severity of the sleep disordered breathing. 2. The patient should be cautioned not to drive, work at heights, or operate dangerous or heavy equipment when tired or sleepy. Review and reiteration of good sleep hygiene measures should be pursued with any patient. 3. This study shows sleep fragmentation and abnormal sleep stage percentages; these are nonspecific findings and per se do not signify an intrinsic sleep disorder or a cause for the patient's sleep-related symptoms. Causes include (but are not limited to) the first night effect of the sleep study, circadian rhythm disturbances, medication effect or an underlying mood disorder or  medical problem.  4. The patient will be seen in follow-up by Dr. Rexene Alberts at [facility] for discussion of the test results and further management strategies. The referring provider will be notified of the test results.   I certify that I have reviewed the entire raw data recording prior to the issuance of this report in accordance with the Standards of Accreditation of the American Academy of Sleep Medicine (AASM)       Star Age, MD, PhD Diplomat, American Board of Psychiatry and Neurology [facility] and Sleep Medicine)

## 2016-09-23 ENCOUNTER — Telehealth: Payer: Self-pay

## 2016-09-23 NOTE — Telephone Encounter (Signed)
Pt left voice message asking for you to call him.  [place] or [phone]

## 2016-09-23 NOTE — Telephone Encounter (Signed)
See documentation from other phone note regarding pt's cpap and sleep study results.

## 2016-09-23 NOTE — Telephone Encounter (Signed)
Pt called me back regarding his sleep study results and cpap. I spent more than 15 minutes on the phone with him explaining again his sleep study results and the importance of treating sleep apnea, risks associated with untreated sleep apnea, and the process to starting a cpap. Pt says that he wants me to go ahead and send the referral for cpap to [facility] and he will discuss with them if he is financially able to start cpap. Pt says that he owes 20% payment to DMEs. Pt is very concerned about his insurance and how much they will cover his cpap. I verified that we do have the correct insurance on file [facility] the card pt brought in October of 2017 is the correct insurance card.) I advised pt that [facility] will be able to discuss financial information with him after filing with his insurance to get a cpap. Pt does not want the mask to cover his ears because he needs to hear his insulin pump alarm during the night. I advised him that [facility] will be able to work with him to get a good mask for him. Pt is asking for a mailed copy of his sleep study results and I verified that we do have the correct address on file for him. I discussed cpap compliance with pt and also advised him not to drive, work at heights, or operate dangerous or heavy equipment when tired or sleepy. Pt verbalized understanding of results. Pt had no questions at this time but was encouraged to call back if questions arise.

## 2016-12-20 ENCOUNTER — Encounter: Payer: Self-pay | Admitting: Neurology

## 2016-12-20 ENCOUNTER — Ambulatory Visit (INDEPENDENT_AMBULATORY_CARE_PROVIDER_SITE_OTHER): Payer: Medicare Other | Admitting: Neurology

## 2016-12-20 VITALS — BP 138/64 | HR 62 | Resp 16 | Ht 72.0 in | Wt 192.0 lb

## 2016-12-20 DIAGNOSIS — Z9989 Dependence on other enabling machines and devices: Secondary | ICD-10-CM | POA: Diagnosis not present

## 2016-12-20 DIAGNOSIS — E0842 Diabetes mellitus due to underlying condition with diabetic polyneuropathy: Secondary | ICD-10-CM

## 2016-12-20 DIAGNOSIS — G4733 Obstructive sleep apnea (adult) (pediatric): Secondary | ICD-10-CM

## 2016-12-20 NOTE — Patient Instructions (Addendum)
Please continue using your CPAP regularly. While your insurance requires that you use CPAP at least 4 hours each night on 70% of the nights, I recommend, that you not skip any nights and use it throughout the night if you can. Getting used to CPAP and staying with the treatment long term does take time and patience and discipline. Untreated obstructive sleep apnea when it is moderate to severe can have an adverse impact on cardiovascular health and raise her risk for heart disease, arrhythmias, hypertension, congestive heart failure, stroke and diabetes. Untreated obstructive sleep apnea causes sleep disruption, nonrestorative sleep, and sleep deprivation. This can have an impact on your day to day functioning and cause daytime sleepiness and impairment of cognitive function, memory loss, mood disturbance, and problems focussing. Using CPAP regularly can improve these symptoms.  Keep up the good work! We will see you back in 6 months for sleep apnea check up, you can see one of our nurse practitioners at the time. I will see you once a year thereafter.   Please call [facility] again for increase in leak.

## 2016-12-20 NOTE — Progress Notes (Signed)
Subjective:    Patient ID: Dillon Lane is a 56 y.o. male.  HPI     Interim history:   [name] is a 56 year old right-handed gentleman with an underlying medical history of type 1 diabetes (diagnosed at 79) with diabetic retinopathy, nephropathy and neuropathy, history of Sz in the context of hypoglycemia in 8/16, history of IBS, hearing loss, hypertension, open angle glaucoma, and overweight state, who returns for follow-up consultation of his OSA. He is unaccompanied today. Her first met him on 07/07/2016 at the request of his PCP, at which time he reported witnessed apneas, snoring and excessive daytime somnolence. He was invited for sleep study. He had a split-night sleep study on 09/06/2016 and I went over his test results with him in detail today. Baseline sleep efficiency was 89.1%, sleep latency 11 minutes, REM sleep and slow-wave sleep were absence, he had increased and light stage sleep. He had moderate to loud snoring. AHI was 45.5 per hour. Average oxygen saturation was 98%, nadir was 88%. He had no significant PLMS, no significant EEG or EKG changes. He was then titrated on CPAP from 5 cm to 14 cm. He had an AHI of 0 per hour on the final pressure with O2 nadir of 90% and supine REM sleep achieved on a pressure of 12 and 13 cm. REM latency was 93% during the second part of the study, sleep efficiency was 95.9%. He also achieved increased percentage of slow-wave sleep during the second part of the study.  Today, 12/20/2016: I reviewed his CPAP compliance data from 11/16/2016 through 12/15/2016, which is a total of 30 days, during which time he used his CPAP every night with percent used days greater than 4 hours at 70%, indicating adequate compliance with an average usage of 5 hours and 27 minutes, residual AHI acceptable at 2.9 per hour, leak on the high side for the 95th percentile at 57.2 L/m on a pressure of 14 cm with EPR of 3. He reports sleeping more soundly at night, hardly any  nocturia any longer, having adjusted reasonably well to CPAP, he still working on it. He has a tendency to forget to put the mask on sometimes after going to the bathroom.   Previously (copied from previous notes for reference):   07/07/2016: He reports snoring and excessive daytime somnolence. His wife has occasionally noted pauses in his breathing while he is asleep. I reviewed your office note from 07/01/2016, which you kindly included. He endorses recent stressors. His wife retired and since then he has been on [employer], but found out recently that a lot of his diabetes supplies are not covered on his new insurance. This has caused him stress and anxiety. He also endorses depression. He has recently seen a counselor, which he found helpful. He is on disability. He does not smoke or drink alcohol but drinks unsweetened iced tea him a several cups per day, about 3-4 at least. Bedtime varies. He admits to not having a set routine. He is typically in bed after 11:30 PM. He does not typically nap. He has some trouble going to sleep and staying asleep. His mind is racing at times. He does get sleepy when sedentary. Epworth sleepiness score is 9 out of 24 today, fatigue score is 30 out of 63. He does not have a family history of obstructive sleep apnea. He denies restless leg symptoms.   The patient's allergies, current medications, family history, past medical history, past social history, past surgical history and problem  list were reviewed and updated as appropriate.    His Past Medical History Is Significant For: Past Medical History:  Diagnosis Date  . Chronic kidney disease    stage 2  . Diabetes mellitus without complication ([place])    DM type 1, on insulin pump.  . Glaucoma     His Past Surgical History Is Significant For: Past Surgical History:  Procedure Laterality Date  . CATARACT EXTRACTION  1994   left eye  . EYE SURGERY     x3 with 9 sessions of lasers for retinopathy.  [name] HAND  SURGERY  2 years ago   x2    His Family History Is Significant For: Family History  Problem Relation Age of Onset  . Brain cancer Mother   . Prostate cancer Father   . Colon cancer Neg Hx   . Colon polyps Neg Hx   . Rectal cancer Neg Hx   . Stomach cancer Neg Hx     His Social History Is Significant For: Social History   Social History  . Marital status: Married    Spouse name: N/A  . Number of children: 2  . Years of education: HS   Occupational History  . Disability     Social History Main Topics  . Smoking status: Never Smoker  . Smokeless tobacco: Never Used  . Alcohol use No  . Drug use: No  . Sexual activity: Not Asked   Other Topics Concern  . None   Social History Narrative   Denies caffeine use     His Allergies Are:  Allergies  Allergen Reactions  . Tetracyclines & Related Shortness Of Breath and Swelling  :   His Current Medications Are:  Outpatient Encounter Prescriptions as of 12/20/2016  Medication Sig  . aspirin 81 MG tablet Take 81 mg by mouth daily.  . bimatoprost (LUMIGAN) 0.01 % SOLN Place 1 drop into both eyes at bedtime.  . cetirizine (ZYRTEC) 10 MG tablet Take 10 mg by mouth as needed.   . Cholecalciferol (VITAMIN D3) 1000 UNITS CAPS Take 1 capsule by mouth daily.  . dorzolamide-timolol (COSOPT) 22.3-6.8 MG/ML ophthalmic solution PLACE 1 DROP INTO BOTH EYES TWICE A DAY  . HUMALOG 100 UNIT/ML injection USE AS DIRECTED IN INSULIN PUMP, MAX 66 UNITS/DAY  . lisinopril-hydrochlorothiazide (PRINZIDE,ZESTORETIC) 20-12.5 MG per tablet Take 1 tablet by mouth 2 (two) times daily.  . [DISCONTINUED] dorzolamide (TRUSOPT) 2 % ophthalmic solution Place 1 drop into both eyes 2 (two) times daily.  . [DISCONTINUED] timolol (TIMOPTIC) 0.5 % ophthalmic solution INSTILL 1 DROP INTO BOTH EYES 2 TIMES DAILY.   No facility-administered encounter medications on file as of 12/20/2016.   :  Review of Systems:  Out of a complete 14 point review of systems, all  are reviewed and negative with the exception of these symptoms as listed below: Review of Systems  Neurological:       Patient states that he is doing well on CPAP. Feels that he is getting more adjusted to PAP therapy.     Objective:  Neurologic Exam  Physical Exam Physical Examination:   Vitals:   12/20/16 1303  BP: 138/64  Pulse: 62  Resp: 16   General Examination: The patient is a very pleasant 56 y.o. male in no acute distress. He appears well-developed and well-nourished and adequately groomed.   HEENT: Normocephalic, atraumatic, pupils are equal, round and mildly reactive to light. Extraocular tracking is good without limitation to gaze excursion or nystagmus noted. Normal  smooth pursuit is noted. Hearing is grossly intact. Face is symmetric with normal facial animation and normal facial sensation. Speech is clear with no dysarthria noted. There is no hypophonia. There is no lip, neck/head, jaw or voice tremor. Neck is supple with full range of passive and active motion. There are no carotid bruits on auscultation. Oropharynx exam reveals: mild mouth dryness, adequate dental hygiene and moderate airway crowding. Mallampati is class II. Tongue protrudes centrally and palate elevates symmetrically. Tonsils are 2+ to 3+.   Chest: Clear to auscultation without wheezing, rhonchi or crackles noted.  Heart: S1+S2+0, regular and normal without murmurs, rubs or gallops noted.   Abdomen: Soft, non-tender and non-distended with normal bowel sounds appreciated on auscultation.  Extremities: There is no pitting edema in the distal lower extremities bilaterally. Pedal pulses are intact.  Skin: Warm and dry without trophic changes noted.  Musculoskeletal: exam reveals no obvious joint deformities, tenderness or joint swelling or erythema.   Neurologically:  Mental status: The patient is awake, alert and oriented in all 4 spheres. His immediate and remote memory, attention, language skills  and fund of knowledge are appropriate. There is no evidence of aphasia, agnosia, apraxia or anomia. Speech is clear with normal prosody and enunciation. Thought process is linear. Mood is normal and affect is normal.  Cranial nerves II - XII are as described above under HEENT exam. In addition: shoulder shrug is normal with equal shoulder height noted. Motor exam: Normal bulk, strength and tone is noted. There is no drift, tremor or rebound. Romberg is negative. Reflexes are 1+ in the UEs and absent in the LEs. Fine motor skills and coordination: intact.  Cerebellar testing: No dysmetria or intention tremor on finger to nose testing. Heel to shin is unremarkable bilaterally. There is no truncal or gait ataxia.  Sensory exam: intact to light touch.  Gait, station and balance: He stands easily. No veering to one side is noted. No leaning to one side is noted. Posture is age-appropriate and stance is narrow based. Gait shows normal stride length and normal pace. No problems turning are noted. Tandem walk is challenging for him.   Assessment and plan:  In summary, JERARD BAYS is a very pleasant 56 y.o.-year old male with An underlying medical history of type 1 diabetes since teenage years, history of diabetic neuropathy, nephropathy, retinopathy, history of seizure in the context of hypoglycemia, irritable bowel syndrome, hearing loss, hypertension, glaucoma and overweight state, who returns for follow-up consultation of his obstructive sleep apnea after his sleep study testing and established now on CPAP therapy. His split-night sleep study from 09/06/2016 was discussed in detail. He has evidence of severe sleep apnea, with good results on CPAP therapy. He is adequately compliant with CPAP treatment, is still adjusting to treatment, leak tends to be on the high side. We talked about his most recent compliance data as well today. He is commended for his CPAP treatment adherence. Physical exam and neurological  exam are stable. He is advised to make an appointment with his DME company for a mask refit. Of note, he has facial hair and uses a fullface mask, also a tendency to sleep on his stomach which may all contribute to the higher leak. Overall, he feels that he sleeps more soundly, no more snoring, less nocturia, he has also been giving a new insulin pump and insulin monitor which has kept his diabetes under better control. He's doing well at this time. I suggested a six-month follow-up with  one of our nurse practitioners and I will see him back after that, hopefully on a yearly basis. I answered all his questions today and the patient was in agreement.  I spent 40 minutes in total face-to-face time with the patient, more than 50% of which was spent in counseling and coordination of care, reviewing test results, reviewing medication and discussing or reviewing the diagnosis of OSA, neuropathy, the prognosis and treatment options. Pertinent laboratory and imaging test results that were available during this visit with the patient were reviewed by me and considered in my medical decision making (see chart for details).

## 2017-06-26 ENCOUNTER — Encounter: Payer: Self-pay | Admitting: Neurology

## 2017-06-27 ENCOUNTER — Ambulatory Visit (INDEPENDENT_AMBULATORY_CARE_PROVIDER_SITE_OTHER): Payer: Medicare Other | Admitting: Adult Health

## 2017-06-27 ENCOUNTER — Encounter: Payer: Self-pay | Admitting: Adult Health

## 2017-06-27 VITALS — BP 146/75 | HR 61 | Wt 194.4 lb

## 2017-06-27 DIAGNOSIS — Z9989 Dependence on other enabling machines and devices: Secondary | ICD-10-CM

## 2017-06-27 DIAGNOSIS — G4733 Obstructive sleep apnea (adult) (pediatric): Secondary | ICD-10-CM | POA: Diagnosis not present

## 2017-06-27 NOTE — Patient Instructions (Signed)
Your Plan:  Continue using cpap nightly  If your symptoms worsen or you develop new symptoms please let us know.   Thank you for coming to see [place] at [facility]. I hope we have been able to provide you high quality care today.  You may receive a patient satisfaction survey over the next few weeks. We would appreciate your feedback and comments so that we may continue to improve ourselves and the health of our patients.

## 2017-06-27 NOTE — Progress Notes (Addendum)
PATIENT: Dillon Lane DOB: 1961-07-26  REASON FOR VISIT: follow up- obstructive sleep apnea on CPAP HISTORY FROM: patient  HISTORY OF PRESENT ILLNESS: Today 06/27/17 Dillon Lane is a 56 year old male with a history of obstructive sleep apnea on CPAP. He returns today for follow-up. His CPAP download indicates that he uses machine 30 out of 30 days for compliance of 100%. He uses machine greater than 4 hours 26 out of 30 days for compliance of 87%. On average he uses his machine 5 hours and 58 minutes. His residual AHI is 2.5 on 14 cm water with EPR of 3. His leak in the 95th percentile is 29.6 L/m. Patient states that he has noticed the benefit with the CPAP machine. He states that he sleeps much better with it. He does state that he sleeps on his stomach which she feels causes the leak. Although his leak has improved since the last visit. He denies any new neurological symptoms. He returns today for an evaluation.  HISTORY Dillon Lane is a 56 year old right-handed gentleman with an underlying medical history of type 1 diabetes (diagnosed at 55) with diabetic retinopathy, nephropathy and neuropathy, history of Sz in the context of hypoglycemia in 8/16, history of IBS, hearing loss, hypertension, open angle glaucoma, and overweight state, who returns for follow-up consultation of his OSA. He is unaccompanied today. Her first met him on 07/07/2016 at the request of his PCP, at which time he reported witnessed apneas, snoring and excessive daytime somnolence. He was invited for sleep study. He had a split-night sleep study on 09/06/2016 and I went over his test results with him in detail today. Baseline sleep efficiency was 89.1%, sleep latency 11 minutes, REM sleep and slow-wave sleep were absence, he had increased and light stage sleep. He had moderate to loud snoring. AHI was 45.5 per hour. Average oxygen saturation was 98%, nadir was 88%. He had no significant PLMS, no significant EEG or EKG changes.  He was then titrated on CPAP from 5 cm to 14 cm. He had an AHI of 0 per hour on the final pressure with O2 nadir of 90% and supine REM sleep achieved on a pressure of 12 and 13 cm. REM latency was 93% during the second part of the study, sleep efficiency was 95.9%. He also achieved increased percentage of slow-wave sleep during the second part of the study.  Today, 12/20/2016: I reviewed his CPAP compliance data from 11/16/2016 through 12/15/2016, which is a total of 30 days, during which time he used his CPAP every night with percent used days greater than 4 hours at 70%, indicating adequate compliance with an average usage of 5 hours and 27 minutes, residual AHI acceptable at 2.9 per hour, leak on the high side for the 95th percentile at 57.2 L/m on a pressure of 14 cm with EPR of 3. He reports sleeping more soundly at night, hardly any nocturia any longer, having adjusted reasonably well to CPAP, he still working on it. He has a tendency to forget to put the mask on sometimes after going to the bathroom.    REVIEW OF SYSTEMS: Out of a complete 14 system review of symptoms, the patient complains only of the following symptoms, and all other reviewed systems are negative.  See history of present illness  ALLERGIES: Allergies  Allergen Reactions  . Tetracyclines & Related Shortness Of Breath and Swelling    HOME MEDICATIONS: Outpatient Medications Prior to Visit  Medication Sig Dispense Refill  . aspirin  81 MG tablet Take 81 mg by mouth daily.    . bimatoprost (LUMIGAN) 0.01 % SOLN Place 1 drop into both eyes at bedtime.    . cetirizine (ZYRTEC) 10 MG tablet Take 10 mg by mouth as needed.     . Cholecalciferol (VITAMIN D3) 1000 UNITS CAPS Take 1 capsule by mouth daily.    [name] HUMALOG 100 UNIT/ML injection USE AS DIRECTED IN INSULIN PUMP, MAX 66 UNITS/DAY  12  . lisinopril-hydrochlorothiazide (PRINZIDE,ZESTORETIC) 20-12.5 MG per tablet Take 1 tablet by mouth 2 (two) times daily.    .  dorzolamide-timolol (COSOPT) 22.3-6.8 MG/ML ophthalmic solution PLACE 1 DROP INTO BOTH EYES TWICE A DAY  6   No facility-administered medications prior to visit.     PAST MEDICAL HISTORY: Past Medical History:  Diagnosis Date  . Chronic kidney disease    stage 2  . Diabetes mellitus without complication ([place])    DM type 1, on insulin pump.  . Glaucoma   . OSA on CPAP     PAST SURGICAL HISTORY: Past Surgical History:  Procedure Laterality Date  . CATARACT EXTRACTION  1994   left eye  . EYE SURGERY     x3 with 9 sessions of lasers for retinopathy.  [name] HAND SURGERY  2 years ago   x2    FAMILY HISTORY: Family History  Problem Relation Age of Onset  . Brain cancer Mother   . Prostate cancer Father   . Colon cancer Neg Hx   . Colon polyps Neg Hx   . Rectal cancer Neg Hx   . Stomach cancer Neg Hx     SOCIAL HISTORY: Social History   Social History  . Marital status: Married    Spouse name: N/A  . Number of children: 2  . Years of education: HS   Occupational History  . Disability     Social History Main Topics  . Smoking status: Never Smoker  . Smokeless tobacco: Never Used  . Alcohol use No  . Drug use: No  . Sexual activity: Not on file   Other Topics Concern  . Not on file   Social History Narrative   Denies caffeine use       PHYSICAL EXAM  Vitals:   06/27/17 1356  BP: (!) 146/75  Pulse: 61  Weight: 194 lb 6.4 oz (88.2 kg)   Body mass index is 26.37 kg/m.  Generalized: Well developed, in no acute distress Chest: Lungs clear to auscultation   Neurological examination  Mentation: Alert oriented to time, place, history taking. Follows all commands speech and language fluent Cranial nerve II-XII: Pupils were equal round reactive to light. Extraocular movements were full, visual field were full on confrontational test. Facial sensation and strength were normal. Uvula tongue midline. Head turning and shoulder shrug  were normal and  symmetric. Motor: The motor testing reveals 5 over 5 strength of all 4 extremities. Good symmetric motor tone is noted throughout.  Sensory: Sensory testing is intact to soft touch on all 4 extremities. No evidence of extinction is noted.  Coordination: Cerebellar testing reveals good finger-nose-finger and heel-to-shin bilaterally.  Gait and station: Gait is steady tandem gait normal Reflexes: Deep tendon reflexes are symmetric and normal bilaterally.   DIAGNOSTIC DATA (LABS, IMAGING, TESTING) - I reviewed patient records, labs, notes, testing and imaging myself where available.     ASSESSMENT AND PLAN 56 y.o. year old male  has a past medical history of Chronic kidney disease; Diabetes mellitus without complication (  [place]); Glaucoma; and OSA on CPAP. here with:  1. Obstructive sleep apnea on CPAP  The patient CPAP download shows good compliance and good treatment of his apnea. His leak is still slightly elevated however it is improved from the previous download. Patient is advised to continue using his CPAP nightly. I did discuss the risk associated with untreated sleep apnea. He voiced understanding. He will follow-up in 6 months or sooner if needed.  I spent 15 minutes with the patient. 50% of this time was spent discussing his CPAP download.      Ward Givens, MSN, NP-C 06/27/2017, 2:12 PM [facility] [address] [place], [place] [address] [phone]  I reviewed the above note and documentation by the Nurse Practitioner and agree with the history, physical exam, assessment and plan as outlined above. I was immediately available for face-to-face consultation. Star Age, MD, PhD [facility])

## 2017-12-10 ENCOUNTER — Encounter: Payer: Self-pay | Admitting: Internal Medicine

## 2017-12-20 ENCOUNTER — Encounter: Payer: Self-pay | Admitting: Neurology

## 2017-12-26 ENCOUNTER — Ambulatory Visit: Payer: Medicare Other | Admitting: Neurology

## 2017-12-26 ENCOUNTER — Encounter: Payer: Self-pay | Admitting: Neurology

## 2017-12-26 VITALS — BP 139/74 | HR 64 | Ht 72.0 in | Wt 194.0 lb

## 2017-12-26 DIAGNOSIS — Z9989 Dependence on other enabling machines and devices: Secondary | ICD-10-CM | POA: Diagnosis not present

## 2017-12-26 DIAGNOSIS — G4733 Obstructive sleep apnea (adult) (pediatric): Secondary | ICD-10-CM | POA: Diagnosis not present

## 2017-12-26 NOTE — Patient Instructions (Addendum)
Please continue using your CPAP regularly. While your insurance requires that you use CPAP at least 4 hours each night on 70% of the nights, I recommend, that you not skip any nights and use it throughout the night if you can. Getting used to CPAP and staying with the treatment long term does take time and patience and discipline. Untreated obstructive sleep apnea when it is moderate to severe can have an adverse impact on cardiovascular health and raise her risk for heart disease, arrhythmias, hypertension, congestive heart failure, stroke and diabetes. Untreated obstructive sleep apnea causes sleep disruption, nonrestorative sleep, and sleep deprivation. This can have an impact on your day to day functioning and cause daytime sleepiness and impairment of cognitive function, memory loss, mood disturbance, and problems focussing. Using CPAP regularly can improve these symptoms.  You have continued to do well on CPAP, please be sure to change your filter every 1-2 months, your mask about every 3 months, hose about 6 months, humidifier chamber about yearly. Some restrictions are imposed by her insurance carrier with regard to how frequently you can get certain supplies.   We can see you in 1 year, you can see Jinny Blossom again and I will see you after that.

## 2017-12-26 NOTE — Progress Notes (Signed)
Subjective:    Patient ID: Dillon Lane is a 57 y.o. male.  HPI     Interim history:   Dillon Lane is a 57 year old right-handed gentleman with an underlying medical history of type 1 diabetes (diagnosed at 85) with diabetic retinopathy, nephropathy and neuropathy, history of Sz in the context of hypoglycemia in 8/16, history of IBS, hearing loss, hypertension, open angle glaucoma, and overweight state, who returns for follow-up consultation of his OSA, on treatment with CPAP therapy. He is unaccompanied today. Saw him on 12/20/2016, at which time we talked about his split-night sleep study results from 09/06/2016. He was compliant with CPAP.   Was seen in the interim by Dillon Lane on 06/27/2017, at which time he was compliant with CPAP therapy.  Today, 12/26/2017: I reviewed his CPAP compliance data from 11/21/17 to 12/20/17, which is a total of 30 days, during which time he used CPAP every night, with percent used days greater than 4 hours at 83%, indicating very good compliance with an average usage of 5 hours and 30 minutes, residual AHI at goal at 2.8 per hour, leak on the high side with the 95th percentile at 32.9 L/m on a pressure of 14 cm with EPR of 3. He reports doing well with CPAP therapy. He feels that his sleep continues to be good quality, he feels that his nocturia is better, he also reports that he seems to have a lesser tendency to get a cold.   The patient's allergies, current medications, family history, past medical history, past social history, past surgical history and problem list were reviewed and updated as appropriate.   Previously (copied from previous notes for reference):   I first met him on 07/07/2016 at the request of his PCP, at which time he reported witnessed apneas, snoring and excessive daytime somnolence. He was invited for sleep study. He had a split-night sleep study on 09/06/2016 and I went over his test results with him in detail today. Baseline sleep  efficiency was 89.1%, sleep latency 11 minutes, REM sleep and slow-wave sleep were absence, he had increased and light stage sleep. He had moderate to loud snoring. AHI was 45.5 per hour. Average oxygen saturation was 98%, nadir was 88%. He had no significant PLMS, no significant EEG or EKG changes. He was then titrated on CPAP from 5 cm to 14 cm. He had an AHI of 0 per hour on the final pressure with O2 nadir of 90% and supine REM sleep achieved on a pressure of 12 and 13 cm. REM latency was 93% during the second part of the study, sleep efficiency was 95.9%. He also achieved increased percentage of slow-wave sleep during the second part of the study.   12/20/2016: I reviewed his CPAP compliance data from 11/16/2016 through 12/15/2016, which is a total of 30 days, during which time he used his CPAP every night with percent used days greater than 4 hours at 70%, indicating adequate compliance with an average usage of 5 hours and 27 minutes, residual AHI acceptable at 2.9 per hour, leak on the high side for the 95th percentile at 57.2 L/m on a pressure of 14 cm with EPR of 3.    07/07/2016: He reports snoring and excessive daytime somnolence. His wife has occasionally noted pauses in his breathing while he is asleep. I reviewed your office note from 07/01/2016, which you kindly included. He endorses recent stressors. His wife retired and since then he has been on [employer], but found out  recently that a lot of his diabetes supplies are not covered on his new insurance. This has caused him stress and anxiety. He also endorses depression. He has recently seen a counselor, which he found helpful. He is on disability. He does not smoke or drink alcohol but drinks unsweetened iced tea him a several cups per day, about 3-4 at least. Bedtime varies. He admits to not having a set routine. He is typically in bed after 11:30 PM. He does not typically nap. He has some trouble going to sleep and staying asleep. His mind is  racing at times. He does get sleepy when sedentary. Epworth sleepiness score is 9 out of 24 today, fatigue score is 30 out of 63. He does not have a family history of obstructive sleep apnea. He denies restless leg symptoms.  His Past Medical History Is Significant For: Past Medical History:  Diagnosis Date  . Chronic kidney disease    stage 2  . Diabetes mellitus without complication ([place])    DM type 1, on insulin pump.  . Glaucoma   . OSA on CPAP     His Past Surgical History Is Significant For: Past Surgical History:  Procedure Laterality Date  . CATARACT EXTRACTION  1994   left eye  . EYE SURGERY     x3 with 9 sessions of lasers for retinopathy.  [name] HAND SURGERY  2 years ago   x2    His Family History Is Significant For: Family History  Problem Relation Age of Onset  . Brain cancer Mother   . Prostate cancer Father   . Colon cancer Neg Hx   . Colon polyps Neg Hx   . Rectal cancer Neg Hx   . Stomach cancer Neg Hx     His Social History Is Significant For: Social History   Socioeconomic History  . Marital status: Married    Spouse name: Not on file  . Number of children: 2  . Years of education: HS  . Highest education level: Not on file  Occupational History  . Occupation: Disability   Social Needs  . Financial resource strain: Not on file  . Food insecurity:    Worry: Not on file    Inability: Not on file  . Transportation needs:    Medical: Not on file    Non-medical: Not on file  Tobacco Use  . Smoking status: Never Smoker  . Smokeless tobacco: Never Used  Substance and Sexual Activity  . Alcohol use: No  . Drug use: No  . Sexual activity: Not on file  Lifestyle  . Physical activity:    Days per week: Not on file    Minutes per session: Not on file  . Stress: Not on file  Relationships  . Social connections:    Talks on phone: Not on file    Gets together: Not on file    Attends religious service: Not on file    Active member of club or  organization: Not on file    Attends meetings of clubs or organizations: Not on file    Relationship status: Not on file  Other Topics Concern  . Not on file  Social History Narrative   Denies caffeine use     His Allergies Are:  Allergies  Allergen Reactions  . Tetracyclines & Related Shortness Of Breath and Swelling  :   His Current Medications Are:  Outpatient Encounter Medications as of 12/26/2017  Medication Sig  . aspirin 81 MG tablet  Take 81 mg by mouth daily.  . bimatoprost (LUMIGAN) 0.01 % SOLN Place 1 drop into both eyes at bedtime.  . cetirizine (ZYRTEC) 10 MG tablet Take 10 mg by mouth as needed.   . Cholecalciferol (VITAMIN D3) 1000 UNITS CAPS Take 1 capsule by mouth daily.  [name] HUMALOG 100 UNIT/ML injection USE AS DIRECTED IN INSULIN PUMP, MAX 66 UNITS/DAY  . lisinopril-hydrochlorothiazide (PRINZIDE,ZESTORETIC) 20-12.5 MG per tablet Take 1 tablet by mouth 2 (two) times daily.  . rosuvastatin (CRESTOR) 20 MG tablet Take 20 mg by mouth once a week.  . timolol (TIMOPTIC) 0.5 % ophthalmic solution    No facility-administered encounter medications on file as of 12/26/2017.   :  Review of Systems:  Out of a complete 14 point review of systems, all are reviewed and negative with the exception of these symptoms as listed below:  Review of Systems  Neurological:       Pt presents today to discuss his cpap. Pt is unsure of how often he should be replacing his cpap supplies.    Objective:  Neurological Exam  Physical Exam Physical Examination:   Vitals:   12/26/17 1458  BP: 139/74  Pulse: 64    General Examination: The patient is a very pleasant 57 y.o. male in no acute distress. He appears well-developed and well-nourished and well groomed. Good spirits.   HEENT: Normocephalic, atraumatic, pupils are equal, round and mildly reactive to light. wears corrective eyeglasses. Extraocular tracking is good without limitation to gaze excursion or nystagmus noted. Normal  smooth pursuit is noted. Hearing is grossly intact. Face is symmetric with normal facial animation and normal facial sensation. Speech is clear with no dysarthria noted. There is no hypophonia. There is no lip, neck/head, jaw or voice tremor. Neck is supple with full range of passive and active motion. There are no carotid bruits on auscultation. Oropharynx exam reveals: no obvious change.   Chest: Clear to auscultation without wheezing, rhonchi or crackles noted.  Heart: S1+S2+0, regular and normal without murmurs, rubs or gallops noted.   Abdomen: Soft, non-tender and non-distended with normal bowel sounds appreciated on auscultation.  Extremities: There is no obvious edema in the distal lower extremities bilaterally.   Skin: Warm and dry without trophic changes noted.  Musculoskeletal: exam reveals no obvious joint deformities, tenderness or joint swelling or erythema.   Neurologically:  Mental status: The patient is awake, alert and oriented in all 4 spheres. His immediate and remote memory, attention, language skills and fund of knowledge are appropriate. There is no evidence of aphasia, agnosia, apraxia or anomia. Speech is clear with normal prosody and enunciation. Thought process is linear. Mood is normal and affect is normal.  Cranial nerves II - XII are as described above under HEENT exam.   Motor exam: Normal bulk, strength and tone is noted. There is no tremor or rebound. Fine motor skills and coordination: intact.  Cerebellar testing: No dysmetria or intention tremor. There is no truncal or gait ataxia.  Sensory exam: intact to light touch.  Gait, station and balance: He stands easily. No veering to one side is noted. No leaning to one side is noted. Posture is age-appropriate and stance is narrow based. Gait shows normal stride length and normal pace. No problems turning are noted.   Assessment and plan:  In summary, Dillon Lane is a very pleasant 57 year old male  with an underlying medical history of type 1 diabetes since teenage years, history of diabetic neuropathy, nephropathy, retinopathy,  history of seizure in the context of hypoglycemia in the past, irritable bowel syndrome, hearing loss, hypertension, glaucoma and overweight state, who returns for follow-up consultation of his obstructive sleep apnea, well established on CPAP therapy at a pressure of 14 cm. He had a split-night sleep study on 12/04/2017and we talked about his results again today. We also reviewed his most recent compliance data. He is compliant with treatment and recommended for this. He is using a full face mask at this time. He has been stable from a medical standpoint, exam is stable. He reports ongoing good results with CPAP therapy and motivation to continue with treatment. I suggested a one-year checkup, he can see Dillon Givens, NP, again next time as he is stable and doing well. Overall, he feels that he sleeps more soundly, no more snoring, less nocturia. The insulin pump and insulin monitor have kept his diabetes under better control.  I answered all his questions today and he was in agreement with the plan. I spent 25 minutes in total face-to-face time with the patient, more than 50% of which was spent in counseling and coordination of care, reviewing test results, reviewing medication and discussing or reviewing the diagnosis of OSA, its prognosis and treatment options. Pertinent laboratory and imaging test results that were available during this visit with the patient were reviewed by me and considered in my medical decision making (see chart for details).

## 2018-01-16 ENCOUNTER — Encounter: Payer: Self-pay | Admitting: Internal Medicine

## 2018-02-28 ENCOUNTER — Telehealth: Payer: Self-pay | Admitting: *Deleted

## 2018-02-28 NOTE — Telephone Encounter (Signed)
Dillon Lane,  This pt has an insulin pump- he has a PV 6-7 FRI with a colon with JMP 6-20 Thursday.  Could you please get pump instructions for him.  Thanks, Lelan Pons

## 2018-02-28 NOTE — Telephone Encounter (Signed)
Insulin letter sent electronically and by fax to Dr Reynaldo Minium. Now awaiting his response.

## 2018-03-02 NOTE — Telephone Encounter (Signed)
We have received correspondence from Dr Jacquiline Doe office indicating simply, "no restrictions." Please see letter under "Media" tab.

## 2018-03-10 ENCOUNTER — Ambulatory Visit (AMBULATORY_SURGERY_CENTER): Payer: Self-pay | Admitting: *Deleted

## 2018-03-10 ENCOUNTER — Other Ambulatory Visit: Payer: Self-pay

## 2018-03-10 VITALS — Ht 72.0 in | Wt 191.8 lb

## 2018-03-10 DIAGNOSIS — Z86010 Personal history of colonic polyps: Secondary | ICD-10-CM

## 2018-03-10 MED ORDER — NA SULFATE-K SULFATE-MG SULF 17.5-3.13-1.6 GM/177ML PO SOLN: 1.0000 | Freq: Once | ORAL | 0 refills | Status: AC

## 2018-03-10 NOTE — Progress Notes (Signed)
No egg or soy allergy known to patient  No issues with past sedation with any surgeries  or procedures, no intubation problems - some PONV in the past - slight  No diet pills per patient No home 02 use per patient  No blood thinners per patient  Pt denies issues with constipation  No A fib or A flutter  EMMI video sent to pt's e mail - pt declined  Pt has an insulin pump- Per Dr Darrow Bussing office , under media tab and TE, no restrictions on pump- pt states pump adjusts itself if his blood sugar decreases below 120---

## 2018-03-13 ENCOUNTER — Encounter: Payer: Self-pay | Admitting: Internal Medicine

## 2018-03-23 ENCOUNTER — Ambulatory Visit (AMBULATORY_SURGERY_CENTER): Payer: Medicare Other | Admitting: Internal Medicine

## 2018-03-23 ENCOUNTER — Encounter: Payer: Self-pay | Admitting: Internal Medicine

## 2018-03-23 ENCOUNTER — Other Ambulatory Visit: Payer: Self-pay

## 2018-03-23 VITALS — BP 131/56 | HR 62 | Temp 98.0°F | Resp 15 | Ht 72.0 in | Wt 191.0 lb

## 2018-03-23 DIAGNOSIS — Z860100 Personal history of colon polyps, unspecified: Secondary | ICD-10-CM

## 2018-03-23 DIAGNOSIS — D128 Benign neoplasm of rectum: Secondary | ICD-10-CM | POA: Diagnosis not present

## 2018-03-23 MED ORDER — SODIUM CHLORIDE 0.9 % IV SOLN: 500.0000 mL | Freq: Once | INTRAVENOUS | Status: DC

## 2018-03-23 NOTE — Op Note (Signed)
[facility] Patient Name: Dillon Lane Procedure Date: 03/23/2018 8:10 AM MRN: [id number] Endoscopist: Jerene Bears , MD Age: 57 Referring MD:  Date of Birth: 1961-04-03 Gender: Male Account #: [id number] Procedure:                Colonoscopy Indications:              Surveillance: Personal history of adenomatous                            polyps on last colonoscopy 5 years ago Medicines:                Monitored Anesthesia Care Procedure:                Pre-Anesthesia Assessment:                           - Prior to the procedure, a History and Physical                            was performed, and patient medications and                            allergies were reviewed. The patient's tolerance of                            previous anesthesia was also reviewed. The risks                            and benefits of the procedure and the sedation                            options and risks were discussed with the patient.                            All questions were answered, and informed consent                            was obtained. Prior Anticoagulants: The patient has                            taken no previous anticoagulant or antiplatelet                            agents. ASA Grade Assessment: III - A patient with                            severe systemic disease. After reviewing the risks                            and benefits, the patient was deemed in                            satisfactory condition to undergo the procedure.  After obtaining informed consent, the colonoscope                            was passed under direct vision. Throughout the                            procedure, the patient's blood pressure, pulse, and                            oxygen saturations were monitored continuously. The                            Colonoscope was introduced through the anus and                            advanced to the cecum,  identified by appendiceal                            orifice and ileocecal valve. The colonoscopy was                            performed without difficulty. The patient tolerated                            the procedure well. The quality of the bowel                            preparation was good. The ileocecal valve,                            appendiceal orifice, and rectum were photographed. Scope In: 8:15:26 AM Scope Out: 8:34:45 AM Scope Withdrawal Time: 0 hours 13 minutes 53 seconds  Total Procedure Duration: 0 hours 19 minutes 19 seconds  Findings:                 The digital rectal exam was normal.                           A 4 mm polyp was found in the distal rectum (just                            proximal to the dentate line). The polyp was                            sessile. The polyp was removed with a cold snare.                            Resection and retrieval were complete.                           The exam was otherwise without abnormality on                            direct and retroflexion views. Complications:  No immediate complications. Estimated Blood Loss:     Estimated blood loss was minimal. Impression:               - One 4 mm polyp in the rectum, removed with a cold                            snare. Resected and retrieved.                           - The examination was otherwise normal on direct                            and retroflexion views. Recommendation:           - Patient has a contact number available for                            emergencies. The signs and symptoms of potential                            delayed complications were discussed with the                            patient. Return to normal activities tomorrow.                            Written discharge instructions were provided to the                            patient.                           - Resume previous diet.                           - Continue present  medications.                           - Await pathology results.                           - Repeat colonoscopy in 5 years for surveillance. Jerene Bears, MD 03/23/2018 8:37:47 AM This report has been signed electronically.

## 2018-03-23 NOTE — Progress Notes (Signed)
Called to room to assist during endoscopic procedure.  Patient ID and intended procedure confirmed with present staff. Received instructions for my participation in the procedure from the performing physician.  

## 2018-03-23 NOTE — Patient Instructions (Signed)
HANDOUT GIVEN: PYOU HAD AN ENDOSCOPIC PROCEDURE TODAY AT [facility]:   Refer to the procedure report that was given to you for any specific questions about what was found during the examination.  If the procedure report does not answer your questions, please call your gastroenterologist to clarify.  If you requested that your care partner not be given the details of your procedure findings, then the procedure report has been included in a sealed envelope for you to review at your convenience later.  YOU SHOULD EXPECT: Some feelings of bloating in the abdomen. Passage of more gas than usual.  Walking can help get rid of the air that was put into your GI tract during the procedure and reduce the bloating. If you had a lower endoscopy (such as a colonoscopy or flexible sigmoidoscopy) you may notice spotting of blood in your stool or on the toilet paper. If you underwent a bowel prep for your procedure, you may not have a normal bowel movement for a few days.  Please Note:  You might notice some irritation and congestion in your nose or some drainage.  This is from the oxygen used during your procedure.  There is no need for concern and it should clear up in a day or so.  SYMPTOMS TO REPORT IMMEDIATELY:   Following lower endoscopy (colonoscopy or flexible sigmoidoscopy):  Excessive amounts of blood in the stool  Significant tenderness or worsening of abdominal pains  Swelling of the abdomen that is new, acute  Fever of 100F or higher   For urgent or emergent issues, a gastroenterologist can be reached at any hour by calling [phone].   DIET:  We do recommend a small meal at first, but then you may proceed to your regular diet.  Drink plenty of fluids but you should avoid alcoholic beverages for 24 hours.  ACTIVITY:  You should plan to take it easy for the rest of today and you should NOT DRIVE or use heavy machinery until tomorrow (because of the sedation medicines used  during the test).    FOLLOW UP: Our staff will call the number listed on your records the next business day following your procedure to check on you and address any questions or concerns that you may have regarding the information given to you following your procedure. If we do not reach you, we will leave a message.  However, if you are feeling well and you are not experiencing any problems, there is no need to return our call.  We will assume that you have returned to your regular daily activities without incident.  If any biopsies were taken you will be contacted by phone or by letter within the next 1-3 weeks.  Please call us at [phone] if you have not heard about the biopsies in 3 weeks.    SIGNATURES/CONFIDENTIALITY: You and/or your care partner have signed paperwork which will be entered into your electronic medical record.  These signatures attest to the fact that that the information above on your After Visit Summary has been reviewed and is understood.  Full responsibility of the confidentiality of this discharge information lies with you and/or your care-partner.OLYPS.

## 2018-03-23 NOTE — Progress Notes (Signed)
To PACU, VSS. Report to [email or web address] 

## 2018-03-24 ENCOUNTER — Telehealth: Payer: Self-pay | Admitting: *Deleted

## 2018-03-24 NOTE — Telephone Encounter (Signed)
  Follow up Call-  Call back number 03/23/2018  Post procedure Call Back phone  # [phone]  Permission to leave phone message Yes  Some recent data might be hidden     Patient questions:  Do you have a fever, pain , or abdominal swelling? No. Pain Score  0 *  Have you tolerated food without any problems? Yes.    Have you been able to return to your normal activities? Yes.    Do you have any questions about your discharge instructions: Diet   No. Medications  No. Follow up visit  No.  Do you have questions or concerns about your Care? No.  Actions: * If pain score is 4 or above: No action needed, pain <4.

## 2018-03-28 ENCOUNTER — Encounter: Payer: Self-pay | Admitting: Internal Medicine

## 2018-12-21 ENCOUNTER — Telehealth: Payer: Self-pay | Admitting: Neurology

## 2018-12-21 NOTE — Telephone Encounter (Signed)
I called pt, had an extended conversation with him. He would prefer to delay his appt due to COVID-19 exposure. He reports no issues with his cpap and is doing well. He asked a few questions about the so clean machine which I was able to answer for him. I offered to request a call from Dr. Rexene Alberts for the pt, but he has no specific questions or concerns at this time. Pt's appt was moved to 07/04/2019 at 11:30am with Jinny Blossom, NP.  I advised pt that if Dr. Rexene Alberts had any concerns based on his cpap download, I would call him. Pt verbalized understanding and is agreeable to this plan.

## 2018-12-21 NOTE — Telephone Encounter (Signed)
Pt is not wanting to come to the office. This will be his yearly CPAP appt. Please call to advise

## 2018-12-21 NOTE — Telephone Encounter (Signed)
I reviewed the patient's CPAP compliance data from 11/21/2018 through 12/20/2018 which is a total of 30 days, during which time the patient used his machine 29 days with percent used days greater than 4 hours at 77%, indicating adequate compliance with an average usage of 5 hours and 19 minutes, residual AHI at goal at 2.5 per hour, leak on the higher side with the 95th percentile at 28.2 L/m on a pressure of 14 cm with EPR of 3. His sleep has been fluctuating, some nights it is quite low and some nights it's high. This is probably fluctuation from body position changes most typically. All in all, compliance looks good and he can follow-up as now scheduled in September. No further action required.

## 2018-12-27 ENCOUNTER — Ambulatory Visit: Payer: Medicare Other | Admitting: Neurology

## 2018-12-28 ENCOUNTER — Ambulatory Visit: Payer: Medicare Other | Admitting: Adult Health

## 2019-05-18 ENCOUNTER — Other Ambulatory Visit (HOSPITAL_COMMUNITY): Payer: Self-pay | Admitting: Internal Medicine

## 2019-05-18 ENCOUNTER — Other Ambulatory Visit: Payer: Self-pay

## 2019-05-18 ENCOUNTER — Ambulatory Visit (HOSPITAL_COMMUNITY)
Admission: RE | Admit: 2019-05-18 | Discharge: 2019-05-18 | Disposition: A | Discharge status: Home / Self Care | Payer: Medicare Other | Source: Ambulatory Visit | Attending: Family | Admitting: Family

## 2019-05-18 DIAGNOSIS — I10 Essential (primary) hypertension: Secondary | ICD-10-CM | POA: Insufficient documentation

## 2019-07-04 ENCOUNTER — Ambulatory Visit: Payer: Self-pay | Admitting: Adult Health

## 2019-07-24 ENCOUNTER — Telehealth (INDEPENDENT_AMBULATORY_CARE_PROVIDER_SITE_OTHER): Payer: Medicare Other | Admitting: Adult Health

## 2019-07-24 DIAGNOSIS — Z9989 Dependence on other enabling machines and devices: Secondary | ICD-10-CM

## 2019-07-24 DIAGNOSIS — G4733 Obstructive sleep apnea (adult) (pediatric): Secondary | ICD-10-CM

## 2019-07-24 NOTE — Progress Notes (Addendum)
PATIENT: Dillon Lane DOB: 02-Oct-1961  REASON FOR VISIT: follow up HISTORY FROM: patient  Virtual Visit via Video Note  I connected with Dillon Lane on 07/24/19 at  2:00 PM EDT by a video enabled telemedicine application located remotely at [facility] and verified that I am speaking with the correct person using two identifiers who was located at their own home.   I discussed the limitations of evaluation and management by telemedicine and the availability of in person appointments. The patient expressed understanding and agreed to proceed.   PATIENT: Dillon Lane DOB: 1961/08/13  REASON FOR VISIT: follow up HISTORY FROM: patient  HISTORY OF PRESENT ILLNESS: Today 07/24/19:  Dillon Lane is a 58 year old male with a history of obstructive sleep apnea on CPAP.  His download indicates that he uses machine 24 out of 30 days for compliance of 80%.  He uses machine greater than 4 hours 19 days for compliance of 63%.  On average he uses his machine 5 hours and 50 minutes.  His residual AHI is 1.7 on 14 cm of water with EPR of 3.  His leak in the 95th percentile is 25.2 L/min.  He currently has the nasal pillows.  He does not feel his mask leaking at night.  He does not have a set bedtime routine.  He reports that he has been diagnosed with stage IV kidney disease.  [name]  He returns today for an evaluation.    HISTORY 12/26/2017: I reviewed his CPAP compliance data from 11/21/17 to 12/20/17, which is a total of 30 days, during which time he used CPAP every night, with percent used days greater than 4 hours at 83%, indicating very good compliance with an average usage of 5 hours and 30 minutes, residual AHI at goal at 2.8 per hour, leak on the high side with the 95th percentile at 32.9 L/m on a pressure of 14 cm with EPR of 3. He reports doing well with CPAP therapy. He feels that his sleep continues to be good quality, he feels that his nocturia is better, he also reports  that he seems to have a lesser tendency to get a cold.   REVIEW OF SYSTEMS: Out of a complete 14 system review of symptoms, the patient complains only of the following symptoms, and all other reviewed systems are negative.  See HPI  ALLERGIES: Allergies  Allergen Reactions   Tetracyclines & Related Shortness Of Breath and Swelling    HOME MEDICATIONS: Outpatient Medications Prior to Visit  Medication Sig Dispense Refill   aspirin 81 MG tablet Take 81 mg by mouth daily.     bimatoprost (LUMIGAN) 0.01 % SOLN Place 1 drop into both eyes at bedtime.     cetirizine (ZYRTEC) 10 MG tablet Take 10 mg by mouth as needed.      Cholecalciferol (VITAMIN D3) 1000 UNITS CAPS Take 1 capsule by mouth daily.     HUMALOG 100 UNIT/ML injection USE AS DIRECTED IN INSULIN PUMP, MAX 66 UNITS/DAY  12   lisinopril-hydrochlorothiazide (PRINZIDE,ZESTORETIC) 20-12.5 MG per tablet Take 1 tablet by mouth 2 (two) times daily.     rosuvastatin (CRESTOR) 20 MG tablet Take 20 mg by mouth once a week.  3   timolol (TIMOPTIC) 0.5 % ophthalmic solution      TIMOPTIC OCUDOSE 0.5 % SOLN      Facility-Administered Medications Prior to Visit  Medication Dose Route Frequency Provider Last Rate Last Dose   0.9 %  sodium chloride infusion  500 mL Intravenous Once Pyrtle, Lajuan Lines, MD        PAST MEDICAL HISTORY: Past Medical History:  Diagnosis Date   Allergy    fall   Cancer [facility])    removed both eyes    Chronic kidney disease    stage 2   Depression    05-2016- no meds    Diabetes mellitus without complication ([place])    DM type 1, on insulin pump.   Glaucoma    Hyperlipidemia    OSA on CPAP    Osteoporosis    Sleep apnea    wears cpap    PAST SURGICAL HISTORY: Past Surgical History:  Procedure Laterality Date   CATARACT EXTRACTION  1994   left eye   COLONOSCOPY     DG BARIUM SWALLOW ([facility] HX)  1994   EYE SURGERY     x3 with 9 sessions of lasers for retinopathy.   glaucoma  surgery     Dr Maylene Roes   HAND SURGERY  2 years ago   x2   POLYPECTOMY     VITRECTOMY     x3    FAMILY HISTORY: Family History  Problem Relation Age of Onset   Brain cancer Mother    Prostate cancer Father    Colon cancer Neg Hx    Colon polyps Neg Hx    Rectal cancer Neg Hx    Stomach cancer Neg Hx    Esophageal cancer Neg Hx     SOCIAL HISTORY: Social History   Socioeconomic History   Marital status: Married    Spouse name: Not on file   Number of children: 2   Years of education: HS   Highest education level: Not on file  Occupational History   Occupation: Disability   Social Designer, fashion/clothing strain: Not on file   Food insecurity    Worry: Not on file    Inability: Not on file   Transportation needs    Medical: Not on file    Non-medical: Not on file  Tobacco Use   Smoking status: Never Smoker   Smokeless tobacco: Never Used  Substance and Sexual Activity   Alcohol use: No   Drug use: No   Sexual activity: Not on file  Lifestyle   Physical activity    Days per week: Not on file    Minutes per session: Not on file   Stress: Not on file  Relationships   Social connections    Talks on phone: Not on file    Gets together: Not on file    Attends religious service: Not on file    Active member of club or organization: Not on file    Attends meetings of clubs or organizations: Not on file    Relationship status: Not on file   Intimate partner violence    Fear of current or ex partner: Not on file    Emotionally abused: Not on file    Physically abused: Not on file    Forced sexual activity: Not on file  Other Topics Concern   Not on file  Social History Narrative   Denies caffeine use       PHYSICAL EXAM Generalized: Well developed, in no acute distress   Neurological examination  Mentation: Alert oriented to time, place, history taking. Follows all commands speech and language fluent Cranial nerve  II-XII:Extraocular movements were full. Facial symmetry noted. uvula tongue midline. Head turning and shoulder shrug  were normal and symmetric. Motor: Good strength throughout subjectively per patient Sensory: Sensory testing is intact to soft touch on all 4 extremities subjectively per patient Coordination: Cerebellar testing reveals good finger-nose-finger  Reflexes: UTA  DIAGNOSTIC DATA (LABS, IMAGING, TESTING) - I reviewed patient records, labs, notes, testing and imaging myself where available.     ASSESSMENT AND PLAN 58 y.o. year old male  has a past medical history of Allergy, Cancer ([place]), Chronic kidney disease, Depression, Diabetes mellitus without complication ([place]), Glaucoma, Hyperlipidemia, OSA on CPAP, Osteoporosis, and Sleep apnea. here with:  1.  Obstructive sleep apnea on CPAP  The patient's download shows suboptimal compliance.  The patient is encouraged to use machine greater than 4 hours each night.  When using the machine his apnea is well treated.  He is encouraged to use the machine nightly and greater than 4 hours each night.  He will follow-up in 1 year or sooner if needed.   I spent 15 minutes with the patient. 50% of this time was spent reviewing CPAP download  Ward Givens, MSN, NP-C 07/24/2019, 2:20 PM [facility] [address], [place], [place] [address] [phone]   I reviewed the above note and documentation by the Nurse Practitioner and agree with the history, exam, assessment and plan as outlined above. I was available for consultation. Star Age, MD, PhD [facility])

## 2019-08-17 ENCOUNTER — Encounter: Payer: Medicare Other | Attending: Nephrology | Admitting: Dietician

## 2019-08-17 DIAGNOSIS — N184 Chronic kidney disease, stage 4 (severe): Secondary | ICD-10-CM

## 2019-08-17 DIAGNOSIS — E1069 Type 1 diabetes mellitus with other specified complication: Secondary | ICD-10-CM | POA: Diagnosis not present

## 2019-08-17 NOTE — Progress Notes (Signed)
Diabetes Self-Management Education  Visit Type: First/Initial  Appt. Start Time: 0930 Appt. End Time: 3875  08/19/2019  [name], identified by name and date of birth, is a 58 y.o. male with a diagnosis of Diabetes: Type 2.   ASSESSMENT This is a video visit with the patient and his wife.   History includes Type 1 Diabetes since 1977, retinopathy, neuropathy, HTN, and CKD (stage 4).  He reports no swelling in his hands and feet.  He does not follow a low sodium diet but does count carbohydrates. He uses a Medtronic 670g insulin pump and a Guardian 3 CGM. He report hypoglycemia unawareness. He reports MD recently mentioned kidney/pancrease transplant. He and his wife would like to learn more about the diet for CKD.  His wife purchased a cookbook, "Renal Diet Cookbook" by Althia Forts, RD.  He sees Dr. Reynaldo Minium (endocrinologist), Dr. Joelyn Oms (nephrologist), and Oretha Ellis, PharmD, CDE  Medications include: see list to include Humalog, vitamin D Labs include:  BUN 48, Creatinine 3.18, Potassium 5.3, Phosphorous 3.6, eGFR 21 06/15/19 Reported weight of 193 lbs stable.  Patient lives with his wife.  He is on disability and does many chores around the house.  His wife works and does Copy.  They are ordering all of their groceries on line.   He walks 45-70 minutes daily. Lactose intolerant Low potassium diet.  Counts carbohydrates.  Instructed patient on a low sodium, low potassium diet with recommendations of 0.8-1 gram protein per kg.  Discussed phosphorous as well and basic guidelines.  Height 6' (1.829 m), weight 193 lb (87.5 kg). Body mass index is 26.18 kg/m.  Diabetes Self-Management Education - 08/19/19 6433      Visit Information   Visit Type  First/Initial      Initial Visit   Diabetes Type  Type 2    Are you currently following a meal plan?  Yes    What type of meal plan do you follow?  low potassium, carbohydrate counting    Are you taking  your medications as prescribed?  Yes    Date Diagnosed  [place]   How would you rate your overall health?  Fair      Psychosocial Assessment   Patient Belief/Attitude about Diabetes  Motivated to manage diabetes    Self-care barriers  None    Self-management support  Doctor's office;Friends;Family;CDE visits    Other persons present  Patient;Spouse/SO    Patient Concerns  Nutrition/Meal planning    Special Needs  None    Preferred Learning Style  No preference indicated    Learning Readiness  Ready    How often do you need to have someone help you when you read instructions, pamphlets, or other written materials from your doctor or pharmacy?  1 - Never      Pre-Education Assessment   Patient understands the diabetes disease and treatment process.  Demonstrates understanding / competency    Patient understands incorporating nutritional management into lifestyle.  Needs Review    Patient undertands incorporating physical activity into lifestyle.  Demonstrates understanding / competency    Patient understands using medications safely.  Demonstrates understanding / competency    Patient understands monitoring blood glucose, interpreting and using results  Demonstrates understanding / competency    Patient understands prevention, detection, and treatment of acute complications.  Demonstrates understanding / competency    Patient understands prevention, detection, and treatment of chronic complications.  Demonstrates understanding /  competency    Patient understands how to develop strategies to address psychosocial issues.  Needs Review    Patient understands how to develop strategies to promote health/change behavior.  Needs Review      Complications   Last HgB A1C per patient/outside source  6.9 %   07/2019 "never that good"   How often do you check your blood sugar?  > 4 times/day      Dietary Intake   Breakfast  sausage, egg biscuit or toast OR ham, egg biscuit OR  yogurt, fruit, granola OR english muffin, low sugar jelly, egg, cheese    Snack (morning)  fresh fruit, NABS    Lunch  burger or hot dog, chips OR homemade soup OR Banquet pot pie    Snack (afternoon)  fruit    Dinner  grilled or baked meat, 2 vegetables, starch OR grilled chicken salad, couscous, increased ranch OR taco with ground [place]    Beverage(s)  water, coke zero (but recently changed to sprite zero), unsweetened iced tea with splenda, iced coffee      Exercise   Exercise Type  Light (walking / raking leaves)    How many days per week to you exercise?  7    How many minutes per day do you exercise?  60    Total minutes per week of exercise  420      Patient Education   Previous Diabetes Education  Yes (please comment)   meets with PharmD/CDE every other month   Nutrition management   Meal options for control of blood glucose level and chronic complications.;Food label reading, portion sizes and measuring food.;Other (comment)   renal diet   Psychosocial adjustment  Worked with patient to identify barriers to care and solutions;Role of stress on diabetes      Individualized Goals (developed by patient)   Nutrition  General guidelines for healthy choices and portions discussed    Physical Activity  Exercise 5-7 days per week;60 minutes per day    Medications  take my medication as prescribed    Monitoring   test my blood glucose as discussed      Post-Education Assessment   Patient understands the diabetes disease and treatment process.  Demonstrates understanding / competency    Patient understands incorporating nutritional management into lifestyle.  Needs Review    Patient undertands incorporating physical activity into lifestyle.  Demonstrates understanding / competency    Patient understands using medications safely.  Demonstrates understanding / competency    Patient understands monitoring blood glucose, interpreting and using results  Demonstrates understanding /  competency    Patient understands prevention, detection, and treatment of acute complications.  Demonstrates understanding / competency    Patient understands prevention, detection, and treatment of chronic complications.  Demonstrates understanding / competency    Patient understands how to develop strategies to address psychosocial issues.  Needs Review    Patient understands how to develop strategies to promote health/change behavior.  Needs Review      Outcomes   Expected Outcomes  Demonstrated interest in learning. Expect positive outcomes    Future DMSE  4-6 wks    Program Status  Completed       Individualized Plan for Diabetes Self-Management Training:   Learning Objective:  Patient will have a greater understanding of diabetes self-management. Patient education plan is to attend individual and/or group sessions per assessed needs and concerns.   Plan:   Patient Instructions  Begin reducing your added sodium.  Goal  is no added salt. Begin reading labels for sodium.  Goal is less than 2000 mg per day.  Sodium is found naturally in foods. Choosing fresh meats (rather than processed), fresh and frozen vegetables without salt and avoiding high sodium condiments, pickles and olives will decrease your sodium intake.  Consider olive oil with lemon juice or vinegar and herbs such as Mrs. Dash on your salad.    Avoid "No Salt, "Lite Salt" and other products with Potassium Chloride in the ingredient list. Continue to follow a low potassium diet.  Begin reading labels for phosphorous.  Avoid those foods that have phos... in the ingredient list.  Phosphorous is a naturally occurring substance.  If your phosphorous is elevated some foods will need to be limited or avoided. 1 dairy choice (such as yogurt or swiss cheese or cottage cheese) per day is acceptable. Continue to transition dark soda to Sprite zero.  Continue to stay hydrated.  Continue your usual diabetes care. Continue to be  active.  Continue to meal plan Doristine Dev[employer] job!!)  Please call if you have any questions.   Expected Outcomes:  Demonstrated interest in learning. Expect positive outcomes  Education material provided: Food label handouts and No sodium seasonings, NIH-NIDDK overview of CKD and diet, NDK Dishing up a Kidney Friendly meal  If problems or questions, patient to contact team via:  Phone and Email  Future DSME appointment: 4-6 wks

## 2019-08-19 NOTE — Patient Instructions (Addendum)
Begin reducing your added sodium.  Goal is no added salt. Begin reading labels for sodium.  Goal is less than 2000 mg per day.  Sodium is found naturally in foods. Choosing fresh meats (rather than processed), fresh and frozen vegetables without salt and avoiding high sodium condiments, pickles and olives will decrease your sodium intake.  Consider olive oil with lemon juice or vinegar and herbs such as Mrs. Dash on your salad.    Avoid "No Salt, "Lite Salt" and other products with Potassium Chloride in the ingredient list. Continue to follow a low potassium diet.  Begin reading labels for phosphorous.  Avoid those foods that have phos... in the ingredient list.  Phosphorous is a naturally occurring substance.  If your phosphorous is elevated some foods will need to be limited or avoided. 1 dairy choice (such as yogurt or swiss cheese or cottage cheese) per day is acceptable. Continue to transition dark soda to Sprite zero.  Continue to stay hydrated.  Continue your usual diabetes care. Continue to be active.  Continue to meal plan [employer] job!!)  Please call if you have any questions.

## 2019-09-06 ENCOUNTER — Encounter: Payer: Medicare Other | Attending: Nephrology | Admitting: Dietician

## 2019-09-06 DIAGNOSIS — E1022 Type 1 diabetes mellitus with diabetic chronic kidney disease: Secondary | ICD-10-CM

## 2019-09-06 DIAGNOSIS — E1069 Type 1 diabetes mellitus with other specified complication: Secondary | ICD-10-CM | POA: Diagnosis not present

## 2019-09-06 DIAGNOSIS — N184 Chronic kidney disease, stage 4 (severe): Secondary | ICD-10-CM

## 2019-09-09 NOTE — Progress Notes (Signed)
Diabetes Self-Management Education  Visit Type: Follow-up  Appt. Start Time: 1315 Appt. End Time: 1400  09/09/2019  [name], identified by name and date of birth, is a 58 y.o. male with a diagnosis of Diabetes: Type 1.   ASSESSMENT Spoke with patient and wife via video visit.  They were at home and I was in my office.  He reports his biggest concern is that he cannot get his blood pressure down.  Hydrozaline was increased and Lasix added.  He is working on decreasing his sodium intake. His last video visit was 11/13.   He has changed from dark soda to clear soda (diet unless treating a low blood sugar).   He has decreased the amount of butter and other fat. He has been following a low potassium diet. He has been walking and has been keeping his 28 yo grandson several days per week.     HISTORY:  includes Type 1 Diabetes since 1977, retinopathy, neuropathy, HTN, and CKD (stage 4).  He reports no swelling in his hands and feet.  He does not follow a low sodium diet but does count carbohydrates. He uses a Medtronic 670g insulin pump and a Guardian 3 CGM. He report hypoglycemia unawareness. He reports MD recently mentioned kidney/pancrease transplant. He and his wife would like to learn more about the diet for CKD.  His wife purchased a cookbook, "Renal Diet Cookbook" by Althia Forts, RD.  He sees Dr. Reynaldo Minium (endocrinologist), Dr. Joelyn Oms (nephrologist), and Oretha Ellis, PharmD, CDE  Medications include: see list to include Humalog, vitamin D Labs include:  BUN 48, Creatinine 3.18, Potassium 5.3, Phosphorous 3.6, eGFR 21 06/15/19 Reported weight of 193 lbs stable.  Patient lives with his wife.  He is on disability and does many chores around the house.  His wife works and does Copy.  They are ordering all of their groceries on line.   He walks 45-70 minutes daily. Lactose intolerant Low potassium diet.  Counts carbohydrates   Diabetes Self-Management  Education - 09/09/19 1148      Visit Information   Visit Type  Follow-up      Initial Visit   Diabetes Type  Type 1    Are you currently following a meal plan?  Yes    What type of meal plan do you follow?  low potassium, low sodium, renal    Are you taking your medications as prescribed?  Yes      Psychosocial Assessment   Patient Belief/Attitude about Diabetes  Motivated to manage diabetes      Pre-Education Assessment   Patient understands the diabetes disease and treatment process.  Demonstrates understanding / competency    Patient understands incorporating nutritional management into lifestyle.  Needs Review    Patient undertands incorporating physical activity into lifestyle.  Demonstrates understanding / competency    Patient understands using medications safely.  Demonstrates understanding / competency    Patient understands monitoring blood glucose, interpreting and using results  Demonstrates understanding / competency    Patient understands prevention, detection, and treatment of acute complications.  Demonstrates understanding / competency    Patient understands prevention, detection, and treatment of chronic complications.  Demonstrates understanding / competency    Patient understands how to develop strategies to address psychosocial issues.  Demonstrates understanding / competency    Patient understands how to develop strategies to promote health/change behavior.  Needs Review      Complications   How often do you check your blood  sugar?  > 4 times/day      Dietary Intake   Breakfast  Scrambled eggs, rinsed country ham, 1/2 pack instant grits, homemade blueberry muffin    Lunch  peanut butter with cheeze its    Dinner  LS country style steack in the crockpot, leached potatoes, green beans      Exercise   Exercise Type  Light (walking / raking leaves)      Patient Education   Previous Diabetes Education  Yes (please comment)   08/17/19   Nutrition management    Other (comment)   renal diet review   Personal strategies to promote health  Lifestyle issues that need to be addressed for better diabetes care      Individualized Goals (developed by patient)   Nutrition  General guidelines for healthy choices and portions discussed;Other (comment)   continue to count carbs, low sodium, renal   Physical Activity  Exercise 5-7 days per week;30 minutes per day    Medications  take my medication as prescribed    Monitoring   test my blood glucose as discussed    Health Coping  discuss diabetes with (comment)   MD, RD, CDE     Patient Self-Evaluation of Goals - Patient rates self as meeting previously set goals (% of time)   Nutrition  >75%    Physical Activity  >75%    Medications  >75%    Monitoring  >75%    Problem Solving  >75%    Reducing Risk  50 - 75 %    Health Coping  >75%      Post-Education Assessment   Patient understands the diabetes disease and treatment process.  Demonstrates understanding / competency    Patient understands incorporating nutritional management into lifestyle.  Needs Review    Patient undertands incorporating physical activity into lifestyle.  Demonstrates understanding / competency    Patient understands using medications safely.  Demonstrates understanding / competency    Patient understands monitoring blood glucose, interpreting and using results  Demonstrates understanding / competency    Patient understands prevention, detection, and treatment of acute complications.  Demonstrates understanding / competency    Patient understands prevention, detection, and treatment of chronic complications.  Demonstrates understanding / competency    Patient understands how to develop strategies to address psychosocial issues.  Demonstrates understanding / competency    Patient understands how to develop strategies to promote health/change behavior.  Needs Review      Outcomes   Expected Outcomes  Demonstrated interest in  learning. Expect positive outcomes    Future DMSE  2 months    Program Status  Not Completed      Subsequent Visit   Since your last visit have you continued or begun to take your medications as prescribed?  Yes    Since your last visit have you had your blood pressure checked?  Yes    Is your most recent blood pressure lower, unchanged, or higher since your last visit?  Unchanged    Since your last visit have you experienced any weight changes?  No change    Since your last visit, are you checking your blood glucose at least once a day?  Yes       Individualized Plan for Diabetes Self-Management Training:   Learning Objective:  Patient will have a greater understanding of diabetes self-management. Patient education plan is to attend individual and/or group sessions per assessed needs and concerns.   Plan:   Patient Instructions  Great job with the changes that you have made. Continue to decreased added salt.   Expected Outcomes:  Demonstrated interest in learning. Expect positive outcomes  Education material provided:   If problems or questions, patient to contact team via:  Phone and Email  Future DSME appointment: 2 months

## 2019-09-09 NOTE — Patient Instructions (Addendum)
Great job with the changes that you have made. Meat portion the size of a deck of cards (max size) recommended. Continue a low Potassium diet Continue to decreased added salt. Discussed processed meat and instant grits are higher in sodium.

## 2019-10-24 ENCOUNTER — Telehealth (HOSPITAL_COMMUNITY): Payer: Self-pay

## 2019-10-24 ENCOUNTER — Other Ambulatory Visit: Payer: Self-pay

## 2019-10-24 DIAGNOSIS — N186 End stage renal disease: Secondary | ICD-10-CM

## 2019-10-24 DIAGNOSIS — Z992 Dependence on renal dialysis: Secondary | ICD-10-CM

## 2019-10-24 NOTE — Telephone Encounter (Signed)

## 2019-10-25 ENCOUNTER — Other Ambulatory Visit: Payer: Self-pay

## 2019-10-25 ENCOUNTER — Ambulatory Visit (HOSPITAL_COMMUNITY)
Admission: RE | Admit: 2019-10-25 | Discharge: 2019-10-25 | Disposition: A | Discharge status: Home / Self Care | Payer: Medicare PPO | Source: Ambulatory Visit | Attending: Vascular Surgery | Admitting: Vascular Surgery

## 2019-10-25 ENCOUNTER — Ambulatory Visit (INDEPENDENT_AMBULATORY_CARE_PROVIDER_SITE_OTHER)
Admission: RE | Admit: 2019-10-25 | Discharge: 2019-10-25 | Disposition: A | Discharge status: Home / Self Care | Payer: Medicare PPO | Source: Ambulatory Visit | Attending: Vascular Surgery | Admitting: Vascular Surgery

## 2019-10-25 DIAGNOSIS — N186 End stage renal disease: Secondary | ICD-10-CM | POA: Diagnosis not present

## 2019-10-25 DIAGNOSIS — Z992 Dependence on renal dialysis: Secondary | ICD-10-CM | POA: Diagnosis not present

## 2019-10-26 ENCOUNTER — Encounter: Payer: Self-pay | Admitting: Vascular Surgery

## 2019-10-26 ENCOUNTER — Other Ambulatory Visit: Payer: Self-pay

## 2019-10-26 ENCOUNTER — Ambulatory Visit (INDEPENDENT_AMBULATORY_CARE_PROVIDER_SITE_OTHER): Payer: Medicare PPO | Admitting: Vascular Surgery

## 2019-10-26 VITALS — BP 161/75 | HR 64 | Temp 97.3°F | Resp 20 | Ht 72.0 in | Wt 195.4 lb

## 2019-10-26 DIAGNOSIS — Z992 Dependence on renal dialysis: Secondary | ICD-10-CM

## 2019-10-26 DIAGNOSIS — N186 End stage renal disease: Secondary | ICD-10-CM

## 2019-10-26 NOTE — Progress Notes (Signed)
Patient ID: Dillon Lane, male   DOB: 1961-06-18, 59 y.o.   MRN: [id number]  Reason for Consult: New Patient (Initial Visit)   Referred by Dillon Bunting, MD  Subjective:     HPI:  Dillon Lane is a 59 y.o. male long-term diabetic.  Now chronic kidney disease stage V.  Has never had dialysis in the past.  He is right-hand dominant.  No previous upper extremity or chest surgeries.  Has never had pacemaker or port.  Does not take blood thinners.  He is on the transplant list at [facility] for kidney and pancreas.  Past Medical History:  Diagnosis Date  . Allergy    fall  . Cancer ([place])    removed both eyes   . Chronic kidney disease    stage 2  . Depression    05-2016- no meds   . Diabetes mellitus without complication ([place])    DM type 1, on insulin pump.  . Glaucoma   . Hyperlipidemia   . OSA on CPAP   . Osteoporosis   . Sleep apnea    wears cpap   Family History  Problem Relation Age of Onset  . Brain cancer Mother   . Prostate cancer Father   . Colon cancer Neg Hx   . Colon polyps Neg Hx   . Rectal cancer Neg Hx   . Stomach cancer Neg Hx   . Esophageal cancer Neg Hx    Past Surgical History:  Procedure Laterality Date  . CATARACT EXTRACTION  1994   left eye  . COLONOSCOPY    . DG BARIUM SWALLOW ([place] [facility])  1994  . EYE SURGERY     x3 with 9 sessions of lasers for retinopathy.  . glaucoma surgery     Dr Dillon Lane  . HAND SURGERY  2 years ago   x2  . POLYPECTOMY    . VITRECTOMY     x3    Short Social History:  Social History   Tobacco Use  . Smoking status: Never Smoker  . Smokeless tobacco: Never Used  Substance Use Topics  . Alcohol use: No    Allergies  Allergen Reactions  . Tetracyclines & Related Shortness Of Breath and Swelling    Current Outpatient Medications  Medication Sig Dispense Refill  . aspirin 81 MG tablet Take 81 mg by mouth daily.    . bimatoprost (LUMIGAN) 0.01 % SOLN Place 1 drop into both eyes at bedtime.    .  cetirizine (ZYRTEC) 10 MG tablet Take 10 mg by mouth as needed.     . Cholecalciferol (VITAMIN D3) 1000 UNITS CAPS Take 1 capsule by mouth daily.    [name] HUMALOG 100 UNIT/ML injection USE AS DIRECTED IN INSULIN PUMP, MAX 66 UNITS/DAY  12  . lisinopril-hydrochlorothiazide (PRINZIDE,ZESTORETIC) 20-12.5 MG per tablet Take 1 tablet by mouth 2 (two) times daily.    . rosuvastatin (CRESTOR) 20 MG tablet Take 20 mg by mouth once a week.  3  . timolol (TIMOPTIC) 0.5 % ophthalmic solution     . TIMOPTIC OCUDOSE 0.5 % SOLN      No current facility-administered medications for this visit.    Review of Systems  Constitutional:  Constitutional negative. HENT: HENT negative.  Eyes: Eyes negative.  Respiratory: Respiratory negative.  Cardiovascular: Cardiovascular negative.  GI: Gastrointestinal negative.  Musculoskeletal: Musculoskeletal negative.  Skin: Skin negative.  Neurological: Neurological negative. Hematologic: Hematologic/lymphatic negative.  Psychiatric: Psychiatric negative.        Objective:  Vitals:   10/26/19 1356  BP: (!) 161/75  Pulse: 64  Resp: 20  Temp: (!) 97.3 F (36.3 C)  SpO2: 98%    Physical Exam HENT:     Head: Normocephalic.     Nose:     Comments: Mask in place Eyes:     Pupils: Pupils are equal, round, and reactive to light.  Cardiovascular:     Rate and Rhythm: Normal rate.     Pulses: Normal pulses.  Pulmonary:     Effort: Pulmonary effort is normal.  Abdominal:     General: Abdomen is flat.     Palpations: Abdomen is soft.  Musculoskeletal:        General: No swelling. Normal range of motion.     Cervical back: Normal range of motion.  Skin:    General: Skin is warm and dry.     Capillary Refill: Capillary refill takes less than 2 seconds.  Neurological:     General: No focal deficit present.     Mental Status: He is alert.  Psychiatric:        Mood and Affect: Mood normal.        Thought Content: Thought content normal.        Judgment:  Judgment normal.     Data: Vein mapping demonstrates suitable right cephalic and basilic vein for fistula creation.  Left basilic vein was not visualized and cephalic vein is marginal.  I also reviewed his arterial duplex which demonstrated right-sided brachial artery measuring 0.52 cm and left measuring 0.4 cm both triphasic.      Assessment/Plan:     59 year old male here for consideration of dialysis access currently CKD 5 never been on dialysis before.  He is right-hand dominant but appears to have more suitable veins in the right upper extremity for dialysis access creation and he is hoping for transplant in the future.  By physical exam he does appear to have a large cephalic vein at the wrist which is possible for dialysis access creation.  I discussed his options being catheter, fistula, graft.  He does have very good understanding of this with hope for home dialysis.  We discussed the risk benefits alternatives of proceeding with both the patient and his wife via FaceTime and we will get him scheduled today.    Dillon Sandy MD [facility]

## 2019-11-05 ENCOUNTER — Encounter (HOSPITAL_COMMUNITY): Payer: Self-pay | Admitting: Vascular Surgery

## 2019-11-06 ENCOUNTER — Other Ambulatory Visit (HOSPITAL_COMMUNITY)
Admission: RE | Admit: 2019-11-06 | Discharge: 2019-11-06 | Disposition: A | Discharge status: Home / Self Care | Payer: Medicare PPO | Source: Ambulatory Visit | Attending: Vascular Surgery | Admitting: Vascular Surgery

## 2019-11-06 ENCOUNTER — Encounter (HOSPITAL_COMMUNITY): Payer: Self-pay | Admitting: Vascular Surgery

## 2019-11-06 ENCOUNTER — Other Ambulatory Visit: Payer: Self-pay

## 2019-11-06 DIAGNOSIS — Z20822 Contact with and (suspected) exposure to covid-19: Secondary | ICD-10-CM | POA: Diagnosis not present

## 2019-11-06 DIAGNOSIS — Z01812 Encounter for preprocedural laboratory examination: Secondary | ICD-10-CM | POA: Diagnosis present

## 2019-11-06 LAB — SARS CORONAVIRUS 2 (TAT 6-24 HRS): SARS Coronavirus 2: NEGATIVE

## 2019-11-06 NOTE — Progress Notes (Signed)
Spoke with pt and his wife, Reita May for pre-op call. Pt denies cardiac history. Pt is a type 1 diabetic. Pt has an insulin pump. He will be calling his diabetic coordinator at his Endocrinologist's office tomorrow to get instructions about his pump before surgery. I did tell him our recommendations, but instructed him to follow whatever his diabetic coordinator instructs him to do. Instructed pt to check his blood sugar when he gets up Thursday AM and every 2 hours until he leaves for the hospital.If blood sugar is 70 or below, treat with 1/2 cup of clear juice (apple or cranberry) (or can use Glucose tabs) and recheck blood sugar 15 minutes after drinking juice. If blood sugar continues to be 70 or below, call the Short Stay department and ask to speak to a nurse. Pt and wife voiced understanding.  Pt had his Covid test done today, states he's been in quarantine since and understands he needs to stay in quarantine until he comes to the hospital.

## 2019-11-08 ENCOUNTER — Ambulatory Visit (HOSPITAL_COMMUNITY): Payer: Medicare PPO | Admitting: Certified Registered Nurse Anesthetist

## 2019-11-08 ENCOUNTER — Encounter (HOSPITAL_COMMUNITY)
Admission: RE | Disposition: A | Discharge status: Home / Self Care | Payer: Self-pay | Source: Home / Self Care | Attending: Vascular Surgery

## 2019-11-08 ENCOUNTER — Encounter (HOSPITAL_COMMUNITY): Payer: Self-pay | Admitting: Vascular Surgery

## 2019-11-08 ENCOUNTER — Other Ambulatory Visit: Payer: Self-pay

## 2019-11-08 ENCOUNTER — Ambulatory Visit (HOSPITAL_COMMUNITY)
Admission: RE | Admit: 2019-11-08 | Discharge: 2019-11-08 | Disposition: A | Discharge status: Home / Self Care | Payer: Medicare PPO | Attending: Vascular Surgery | Admitting: Vascular Surgery

## 2019-11-08 DIAGNOSIS — M810 Age-related osteoporosis without current pathological fracture: Secondary | ICD-10-CM | POA: Insufficient documentation

## 2019-11-08 DIAGNOSIS — Z79899 Other long term (current) drug therapy: Secondary | ICD-10-CM | POA: Diagnosis not present

## 2019-11-08 DIAGNOSIS — Z9641 Presence of insulin pump (external) (internal): Secondary | ICD-10-CM | POA: Insufficient documentation

## 2019-11-08 DIAGNOSIS — Z7982 Long term (current) use of aspirin: Secondary | ICD-10-CM | POA: Insufficient documentation

## 2019-11-08 DIAGNOSIS — F329 Major depressive disorder, single episode, unspecified: Secondary | ICD-10-CM | POA: Diagnosis not present

## 2019-11-08 DIAGNOSIS — E1022 Type 1 diabetes mellitus with diabetic chronic kidney disease: Secondary | ICD-10-CM | POA: Diagnosis present

## 2019-11-08 DIAGNOSIS — G4733 Obstructive sleep apnea (adult) (pediatric): Secondary | ICD-10-CM | POA: Diagnosis not present

## 2019-11-08 DIAGNOSIS — N189 Chronic kidney disease, unspecified: Secondary | ICD-10-CM

## 2019-11-08 DIAGNOSIS — Z794 Long term (current) use of insulin: Secondary | ICD-10-CM | POA: Diagnosis not present

## 2019-11-08 DIAGNOSIS — N185 Chronic kidney disease, stage 5: Secondary | ICD-10-CM | POA: Insufficient documentation

## 2019-11-08 DIAGNOSIS — E785 Hyperlipidemia, unspecified: Secondary | ICD-10-CM | POA: Insufficient documentation

## 2019-11-08 HISTORY — PX: AV FISTULA PLACEMENT: SHX1204

## 2019-11-08 HISTORY — DX: Unspecified background retinopathy: H35.00

## 2019-11-08 HISTORY — DX: Other specified postprocedural states: Z98.890

## 2019-11-08 HISTORY — DX: Nausea with vomiting, unspecified: R11.2

## 2019-11-08 HISTORY — DX: Syncope and collapse: R55

## 2019-11-08 HISTORY — DX: Essential (primary) hypertension: I10

## 2019-11-08 LAB — GLUCOSE, CAPILLARY: Glucose-Capillary: 129 mg/dL — ABNORMAL HIGH (ref 70–99)

## 2019-11-08 LAB — POCT I-STAT, CHEM 8
BUN: 54 mg/dL — ABNORMAL HIGH (ref 6–20)
Calcium, Ion: 1.15 mmol/L (ref 1.15–1.40)
Chloride: 95 mmol/L — ABNORMAL LOW (ref 98–111)
Creatinine, Ser: 4.9 mg/dL — ABNORMAL HIGH (ref 0.61–1.24)
Glucose, Bld: 167 mg/dL — ABNORMAL HIGH (ref 70–99)
HCT: 38 % — ABNORMAL LOW (ref 39.0–52.0)
Hemoglobin: 12.9 g/dL — ABNORMAL LOW (ref 13.0–17.0)
Potassium: 4.2 mmol/L (ref 3.5–5.1)
Sodium: 131 mmol/L — ABNORMAL LOW (ref 135–145)
TCO2: 26 mmol/L (ref 22–32)

## 2019-11-08 SURGERY — ARTERIOVENOUS (AV) FISTULA CREATION
Anesthesia: Monitor Anesthesia Care | Site: Arm Lower | Laterality: Right

## 2019-11-08 MED ORDER — LIDOCAINE-EPINEPHRINE 1 %-1:100000 IJ SOLN
INTRAMUSCULAR | Status: AC
  Filled 2019-11-08: qty 1

## 2019-11-08 MED ORDER — PROMETHAZINE HCL 25 MG/ML IJ SOLN: 6.2500 mg | INTRAMUSCULAR | Status: DC | PRN

## 2019-11-08 MED ORDER — PROPOFOL 1000 MG/100ML IV EMUL
INTRAVENOUS | Status: AC
  Filled 2019-11-08: qty 200

## 2019-11-08 MED ORDER — CEFAZOLIN SODIUM-DEXTROSE 2-4 GM/100ML-% IV SOLN
2.0000 g | INTRAVENOUS | Status: AC
  Administered 2019-11-08: 2 g via INTRAVENOUS

## 2019-11-08 MED ORDER — SODIUM CHLORIDE 0.9 % IV SOLN
INTRAVENOUS | Status: AC
  Filled 2019-11-08: qty 1.2

## 2019-11-08 MED ORDER — FENTANYL CITRATE (PF) 250 MCG/5ML IJ SOLN
INTRAMUSCULAR | Status: AC
  Filled 2019-11-08: qty 5

## 2019-11-08 MED ORDER — ACETAMINOPHEN 500 MG PO TABS: 1000.0000 mg | ORAL_TABLET | Freq: Once | ORAL | Status: DC

## 2019-11-08 MED ORDER — LIDOCAINE-EPINEPHRINE 1 %-1:100000 IJ SOLN
INTRAMUSCULAR | Status: DC | PRN
  Administered 2019-11-08: 9 mL

## 2019-11-08 MED ORDER — FENTANYL CITRATE (PF) 100 MCG/2ML IJ SOLN: 25.0000 ug | INTRAMUSCULAR | Status: DC | PRN

## 2019-11-08 MED ORDER — ONDANSETRON HCL 4 MG/2ML IJ SOLN
INTRAMUSCULAR | Status: AC
  Filled 2019-11-08: qty 2

## 2019-11-08 MED ORDER — HYDROCODONE-ACETAMINOPHEN 5-325 MG PO TABS: 1.0000 | ORAL_TABLET | Freq: Four times a day (QID) | ORAL | 0 refills | Status: DC | PRN

## 2019-11-08 MED ORDER — PROPOFOL 10 MG/ML IV BOLUS
INTRAVENOUS | Status: AC
  Filled 2019-11-08: qty 20

## 2019-11-08 MED ORDER — LIDOCAINE 2% (20 MG/ML) 5 ML SYRINGE
INTRAMUSCULAR | Status: DC | PRN
  Administered 2019-11-08: 40 mg via INTRAVENOUS

## 2019-11-08 MED ORDER — 0.9 % SODIUM CHLORIDE (POUR BTL) OPTIME
TOPICAL | Status: DC | PRN
  Administered 2019-11-08: 09:00:00 1000 mL

## 2019-11-08 MED ORDER — SODIUM CHLORIDE 0.9 % IV SOLN
INTRAVENOUS | Status: DC | PRN
  Administered 2019-11-08: 500 mL

## 2019-11-08 MED ORDER — MIDAZOLAM HCL 5 MG/5ML IJ SOLN
INTRAMUSCULAR | Status: DC | PRN
  Administered 2019-11-08: 2 mg via INTRAVENOUS

## 2019-11-08 MED ORDER — PROPOFOL 500 MG/50ML IV EMUL
INTRAVENOUS | Status: DC | PRN
  Administered 2019-11-08: 75 ug/kg/min via INTRAVENOUS

## 2019-11-08 MED ORDER — FENTANYL CITRATE (PF) 100 MCG/2ML IJ SOLN
INTRAMUSCULAR | Status: DC | PRN
  Administered 2019-11-08: 25 ug via INTRAVENOUS

## 2019-11-08 MED ORDER — DEXAMETHASONE SODIUM PHOSPHATE 10 MG/ML IJ SOLN
INTRAMUSCULAR | Status: AC
  Filled 2019-11-08: qty 1

## 2019-11-08 MED ORDER — DEXAMETHASONE SODIUM PHOSPHATE 10 MG/ML IJ SOLN
INTRAMUSCULAR | Status: DC | PRN
  Administered 2019-11-08: 5 mg via INTRAVENOUS

## 2019-11-08 MED ORDER — CHLORHEXIDINE GLUCONATE 4 % EX LIQD: 60.0000 mL | Freq: Once | CUTANEOUS | Status: DC

## 2019-11-08 MED ORDER — SODIUM CHLORIDE 0.9 % IV SOLN: INTRAVENOUS | Status: DC

## 2019-11-08 MED ORDER — PROPOFOL 10 MG/ML IV BOLUS
INTRAVENOUS | Status: DC | PRN
  Administered 2019-11-08: 20 mg via INTRAVENOUS
  Administered 2019-11-08: 30 mg via INTRAVENOUS

## 2019-11-08 MED ORDER — ONDANSETRON HCL 4 MG/2ML IJ SOLN
INTRAMUSCULAR | Status: DC | PRN
  Administered 2019-11-08: 4 mg via INTRAVENOUS

## 2019-11-08 MED ORDER — PHENYLEPHRINE 40 MCG/ML (10ML) SYRINGE FOR IV PUSH (FOR BLOOD PRESSURE SUPPORT)
PREFILLED_SYRINGE | INTRAVENOUS | Status: DC | PRN
  Administered 2019-11-08: 20 ug via INTRAVENOUS

## 2019-11-08 MED ORDER — LIDOCAINE 2% (20 MG/ML) 5 ML SYRINGE
INTRAMUSCULAR | Status: AC
  Filled 2019-11-08: qty 5

## 2019-11-08 MED ORDER — MIDAZOLAM HCL 2 MG/2ML IJ SOLN
INTRAMUSCULAR | Status: AC
  Filled 2019-11-08: qty 2

## 2019-11-08 MED ORDER — CEFAZOLIN SODIUM-DEXTROSE 2-4 GM/100ML-% IV SOLN
INTRAVENOUS | Status: AC
  Filled 2019-11-08: qty 100

## 2019-11-08 SURGICAL SUPPLY — 33 items
ADH SKN CLS APL DERMABOND .7 (GAUZE/BANDAGES/DRESSINGS) ×1
ARMBAND PINK RESTRICT EXTREMIT (MISCELLANEOUS) ×3 IMPLANT
CANISTER SUCT 3000ML PPV (MISCELLANEOUS) ×3 IMPLANT
CLIP VESOCCLUDE MED 6/CT (CLIP) ×3 IMPLANT
CLIP VESOCCLUDE SM WIDE 6/CT (CLIP) ×3 IMPLANT
COVER PROBE W GEL 5X96 (DRAPES) IMPLANT
COVER WAND RF STERILE (DRAPES) ×1 IMPLANT
DERMABOND ADVANCED (GAUZE/BANDAGES/DRESSINGS) ×2
DERMABOND ADVANCED .7 DNX12 (GAUZE/BANDAGES/DRESSINGS) ×1 IMPLANT
ELECT REM PT RETURN 9FT ADLT (ELECTROSURGICAL) ×3
ELECTRODE REM PT RTRN 9FT ADLT (ELECTROSURGICAL) ×1 IMPLANT
GLOVE BIO SURGEON STRL SZ 6.5 (GLOVE) ×2 IMPLANT
GLOVE BIO SURGEON STRL SZ7.5 (GLOVE) ×3 IMPLANT
GLOVE BIO SURGEONS STRL SZ 6.5 (GLOVE) ×2
GLOVE BIOGEL PI IND STRL 6.5 (GLOVE) IMPLANT
GLOVE BIOGEL PI INDICATOR 6.5 (GLOVE) ×10
GOWN STRL REUS W/ TWL LRG LVL3 (GOWN DISPOSABLE) ×2 IMPLANT
GOWN STRL REUS W/ TWL XL LVL3 (GOWN DISPOSABLE) ×1 IMPLANT
GOWN STRL REUS W/TWL LRG LVL3 (GOWN DISPOSABLE) ×6
GOWN STRL REUS W/TWL XL LVL3 (GOWN DISPOSABLE) ×3
INSERT FOGARTY SM (MISCELLANEOUS) IMPLANT
KIT BASIN OR (CUSTOM PROCEDURE TRAY) ×3 IMPLANT
KIT TURNOVER KIT B (KITS) ×3 IMPLANT
NS IRRIG 1000ML POUR BTL (IV SOLUTION) ×3 IMPLANT
PACK CV ACCESS (CUSTOM PROCEDURE TRAY) ×3 IMPLANT
PAD ARMBOARD 7.5X6 YLW CONV (MISCELLANEOUS) ×6 IMPLANT
SUT MNCRL AB 4-0 PS2 18 (SUTURE) ×3 IMPLANT
SUT PROLENE 6 0 BV (SUTURE) ×3 IMPLANT
SUT VIC AB 3-0 SH 27 (SUTURE) ×3
SUT VIC AB 3-0 SH 27X BRD (SUTURE) ×1 IMPLANT
TOWEL GREEN STERILE (TOWEL DISPOSABLE) ×3 IMPLANT
UNDERPAD 30X30 (UNDERPADS AND DIAPERS) ×3 IMPLANT
WATER STERILE IRR 1000ML POUR (IV SOLUTION) ×3 IMPLANT

## 2019-11-08 NOTE — Discharge Instructions (Signed)
   [facility]  Discharge Instructions  AV Fistula or Graft Surgery for Dialysis Access  Please refer to the following instructions for your post-procedure care. Your surgeon or physician assistant will discuss any changes with you.  Activity  You may drive the day following your surgery, if you are comfortable and no longer taking prescription pain medication. Resume full activity as the soreness in your incision resolves.  Bathing/Showering  You may shower after you go home. Keep your incision dry for 48 hours. Do not soak in a bathtub, hot tub, or swim until the incision heals completely. You may not shower if you have a hemodialysis catheter.  Incision Care  Clean your incision with mild soap and water after 48 hours. Pat the area dry with a clean towel. You do not need a bandage unless otherwise instructed. Do not apply any ointments or creams to your incision. You may have skin glue on your incision. Do not peel it off. It will come off on its own in about one week. Your arm may swell a bit after surgery. To reduce swelling use pillows to elevate your arm so it is above your heart. Your doctor will tell you if you need to lightly wrap your arm with an ACE bandage.  Diet  Resume your normal diet. There are not special food restrictions following this procedure. In order to heal from your surgery, it is CRITICAL to get adequate nutrition. Your body requires vitamins, minerals, and protein. Vegetables are the best source of vitamins and minerals. Vegetables also provide the perfect balance of protein. Processed food has little nutritional value, so try to avoid this.  Medications  Resume taking all of your medications. If your incision is causing pain, you may take over-the counter pain relievers such as acetaminophen (Tylenol). If you were prescribed a stronger pain medication, please be aware these medications can cause nausea and constipation. Prevent  nausea by taking the medication with a snack or meal. Avoid constipation by drinking plenty of fluids and eating foods with high amount of fiber, such as fruits, vegetables, and grains.  Do not take Tylenol if you are taking prescription pain medications.  Follow up Your surgeon may want to see you in the office following your access surgery. If so, this will be arranged at the time of your surgery.  Please call us immediately for any of the following conditions:  . Increased pain, redness, drainage (pus) from your incision site . Fever of 101 degrees or higher . Severe or worsening pain at your incision site . Hand pain or numbness. .  Reduce your risk of vascular disease:  . Stop smoking. If you would like help, call [facility] at 1-800-QUIT-NOW (630)185-7880) or [place] at 562-853-6756  . Manage your cholesterol . Maintain a desired weight . Control your diabetes . Keep your blood pressure down  Dialysis  It will take several weeks to several months for your new dialysis access to be ready for use. Your surgeon will determine when it is okay to use it. Your nephrologist will continue to direct your dialysis. You can continue to use your Permcath until your new access is ready for use.   11/08/2019 Dillon Lane [id number] [birth year]  Surgeon(s): Waynetta Sandy, MD  Procedure(s): Creation of right radial cephalic AV fistula  x Do not stick fistula for 12 weeks    If you have any questions, please call the office at [phone].

## 2019-11-08 NOTE — Anesthesia Postprocedure Evaluation (Signed)
Anesthesia Post Note  Patient: Dillon Lane  Procedure(s) Performed: Creation of RIGHT ARM Radiocephalic ARTERIOVENOUS (AV) FISTULA (Right Arm Lower)     Patient location during evaluation: PACU Anesthesia Type: MAC Level of consciousness: awake and alert Pain management: pain level controlled Vital Signs Assessment: post-procedure vital signs reviewed and stable Respiratory status: spontaneous breathing and respiratory function stable Cardiovascular status: stable Postop Assessment: no apparent nausea or vomiting Anesthetic complications: no    Last Vitals:  Vitals:   11/08/19 1038 11/08/19 1053  BP:  121/68  Pulse:  66  Resp:  11  Temp: 36.5 C 36.6 C  SpO2:  97%    Last Pain:  Vitals:   11/08/19 1053  TempSrc:   PainSc: 0-No pain                 Oluchi Pucci DANIEL

## 2019-11-08 NOTE — Transfer of Care (Signed)
Immediate Anesthesia Transfer of Care Note  Patient: Dillon Lane  Procedure(s) Performed: Creation of RIGHT ARM Radiocephalic ARTERIOVENOUS (AV) FISTULA (Right Arm Lower)  Patient Location: PACU  Anesthesia Type:MAC  Level of Consciousness: awake, alert  and oriented  Airway & Oxygen Therapy: Patient Spontanous Breathing and Patient connected to face mask oxygen  Post-op Assessment: Report given to RN and Post -op Vital signs reviewed and stable  Post vital signs: Reviewed and stable  Last Vitals:  Vitals Value Taken Time  BP 106/61 11/08/19 1038  Temp    Pulse 67 11/08/19 1038  Resp 13 11/08/19 1038  SpO2 98 % 11/08/19 1038  Vitals shown include unvalidated device data.  Last Pain:  Vitals:   11/08/19 0911  TempSrc:   PainSc: 0-No pain         Complications: No apparent anesthesia complications

## 2019-11-08 NOTE — Anesthesia Preprocedure Evaluation (Addendum)
Anesthesia Evaluation  Patient identified by MRN, date of birth, ID band Patient awake    Reviewed: Allergy & Precautions, NPO status , Patient's Chart, lab work & pertinent test results  History of Anesthesia Complications (+) PONV and history of anesthetic complications  Airway Mallampati: II  TM Distance: >3 FB Neck ROM: Full    Dental  (+) Dental Advisory Given   Pulmonary sleep apnea ,    Pulmonary exam normal        Cardiovascular hypertension, negative cardio ROS Normal cardiovascular exam     Neuro/Psych PSYCHIATRIC DISORDERS Depression negative neurological ROS     GI/Hepatic negative GI ROS, Neg liver ROS,   Endo/Other  diabetes  Renal/GU Renal InsufficiencyRenal disease     Musculoskeletal negative musculoskeletal ROS (+)   Abdominal   Peds  Hematology negative hematology ROS (+)   Anesthesia Other Findings Day of surgery medications reviewed with the patient.  Reproductive/Obstetrics                            Anesthesia Physical Anesthesia Plan  ASA: III  Anesthesia Plan: MAC   Post-op Pain Management:    Induction:   PONV Risk Score and Plan: 2 and Ondansetron and Propofol infusion  Airway Management Planned: Natural Airway  Additional Equipment:   Intra-op Plan:   Post-operative Plan:   Informed Consent: I have reviewed the patients History and Physical, chart, labs and discussed the procedure including the risks, benefits and alternatives for the proposed anesthesia with the patient or authorized representative who has indicated his/her understanding and acceptance.     Dental advisory given  Plan Discussed with: Anesthesiologist and CRNA  Anesthesia Plan Comments:        Anesthesia Quick Evaluation

## 2019-11-08 NOTE — Progress Notes (Signed)
Dr. Tobias Alexander made aware of CBG 129 and insulin pump and to bedside. Insulin pump was taken off, but per Dr. Tobias Alexander at bedside and pt preference, insulin pump reconnected. Per pt, he has communicated with diabetes coordinator and has followed instructions given.

## 2019-11-08 NOTE — Anesthesia Procedure Notes (Signed)
Procedure Name: MAC Date/Time: 11/08/2019 9:45 AM Performed by: Trinna Post., CRNA Pre-anesthesia Checklist: Patient identified, Emergency Drugs available, Suction available, Patient being monitored and Timeout performed Patient Re-evaluated:Patient Re-evaluated prior to induction Oxygen Delivery Method: Simple face mask Preoxygenation: Pre-oxygenation with 100% oxygen Induction Type: IV induction Placement Confirmation: positive ETCO2

## 2019-11-08 NOTE — H&P (Signed)
   History and Physical Update  The patient was interviewed and re-examined.  The patient's previous History and Physical has been reviewed and is unchanged from recent office visit. Plan for right arm avf today in OR.   Cyprian Gongaware C. Donzetta Matters, MD [facility] [place] Office: [phone] Pager: [phone]   11/08/2019, 8:32 AM

## 2019-11-08 NOTE — Op Note (Signed)
    Patient name: Dillon Lane MRN: [id number] DOB: 05-08-1961 Sex: male  11/08/2019 Pre-operative Diagnosis: Chronic kidney disease stage V Post-operative diagnosis:  Same Surgeon:  Eda Paschal. Donzetta Matters, MD Assistant: Leontine Locket, PA Procedure Performed: Right arm radiocephalic AV fistula creation  Indications: 59 year old male with chronic kidney disease.  He is now indicated for permanent access.  He is right-hand dominant but does not appear to have suitable veins in the left arm on the right arm he does have a palpable cephalic vein at the wrist.  He is indicated for cephalic versus basilic vein on the right side.  Findings: Cephalic vein measured 3 mm external diameter at the wrist.  Radial artery was 3 mm external diameter as well.  At completion there was pulsatility in the renal vein did dilate well.  There was good ulnar artery signal at the wrist.  Hand was well-perfused.   Procedure:  The patient was identified in the holding area and taken to the operating room where he was placed to proper table MAC anesthesia induced.  He was sterilely prepped and draped in the right upper extremity usual fashion antibiotics were administered timeout was called.  The area around the wrist was palpated for cephalic vein and radial artery.  It was then anesthetized 1% lidocaine.  Transverse incision was made through dissected out the vein.  The vein was dissected out using minimal touch technique it appeared suitable for fistula creation.  Was marked for orientation.  Then dissected further to the deep fascia identified the radial artery and a vessel loop was placed around this.  I then transected my vein distally and clipped it.  I serially dilated up to 3 mm flushed with heparinized saline and clamped.  I then clamped artery distally proximally opened longitudinally and flushed with heparinized saline distally.  I then sewed my vein into side with 6-0 Prolene suture.  Prior completion allowed flushing all  directions.  Upon completion the vein plumped up very nicely.  There is pulsatility throughout the forearm.  There was good signal in the renal vein.  There is good ulnar artery signal at the wrist.  Satisfied we obtain hemostasis we closed the skin overlying the fascia with 4-0 Monocryl.  Dermabond was placed at the level of skin.  He was awakened anesthesia having tolerated procedure without immediate complication.  Counts were correct at completion.  EBL: 10 cc    Sydni Elizarraraz C. Donzetta Matters, MD [facility] [place] Office: [phone] Pager: [phone]

## 2019-11-09 ENCOUNTER — Telehealth: Payer: Self-pay

## 2019-11-09 NOTE — Telephone Encounter (Signed)
Pt called asking if he can sleep on R side after having R arm AVF placed yesterday. Encouraged him to not lay on fistula arm and keep it elevated on pillow is best. No further questions/concerns at this time.

## 2019-11-15 ENCOUNTER — Encounter: Payer: Medicare Other | Admitting: Dietician

## 2019-11-15 ENCOUNTER — Ambulatory Visit: Payer: Medicare Other | Admitting: Dietician

## 2019-11-29 ENCOUNTER — Other Ambulatory Visit (HOSPITAL_COMMUNITY): Payer: Medicare PPO

## 2019-11-29 ENCOUNTER — Encounter (HOSPITAL_COMMUNITY): Payer: Medicare PPO

## 2019-11-30 ENCOUNTER — Encounter: Payer: Medicare Other | Admitting: Vascular Surgery

## 2019-11-30 ENCOUNTER — Encounter (HOSPITAL_COMMUNITY): Payer: Medicare PPO

## 2019-11-30 ENCOUNTER — Other Ambulatory Visit (HOSPITAL_COMMUNITY): Payer: Medicare PPO

## 2019-12-19 ENCOUNTER — Other Ambulatory Visit: Payer: Self-pay | Admitting: *Deleted

## 2019-12-19 DIAGNOSIS — Z992 Dependence on renal dialysis: Secondary | ICD-10-CM

## 2019-12-19 DIAGNOSIS — N186 End stage renal disease: Secondary | ICD-10-CM

## 2019-12-19 NOTE — Progress Notes (Signed)
POST OPERATIVE OFFICE NOTE    CC:  F/u for surgery; 6 weeks post-op  HPI:  This is a 59 y.o. male who is s/p right radiocephalic AV fistula on 04/08/5464 by Dr. Donzetta Matters.  We conducted the interview and physical exam with his wife on FaceTime.  He denies hand pain or numbness.  No fever or chills.  He has not yet requiring hemodialysis   Allergies  Allergen Reactions  . Tetracyclines & Related Shortness Of Breath and Swelling  . Milk-Related Compounds Diarrhea    Bloating, cramping  . Nsaids     Due to poor kidney function    Current Outpatient Medications  Medication Sig Dispense Refill  . aspirin 81 MG tablet Take 81 mg by mouth daily.    . bimatoprost (LUMIGAN) 0.01 % SOLN Place 1 drop into both eyes at bedtime.    . Carboxymethylcellul-Glycerin (LUBRICATING EYE DROPS OP) Place 1 drop into the right eye daily as needed (dry eyes).    . cetirizine (ZYRTEC) 10 MG tablet Take 10 mg by mouth daily as needed for allergies.     . Cholecalciferol (VITAMIN D) 50 MCG (2000 UT) tablet Take 2,000 Units by mouth daily.    . Colloidal Oatmeal (GOLD BOND ECZEMA RELIEF EX) Apply 1 application topically daily as needed (eczema).    . Emollient (UDDERLY SMOOTH) CREA Apply 1 application topically daily as needed (itching).    . furosemide (LASIX) 40 MG tablet Take 40 mg by mouth daily.    [name] HUMALOG 100 UNIT/ML injection Inject 45-50 Units into the skin daily. Uses in insulin pump averages about 45-50 units per day  12  . hydrALAZINE (APRESOLINE) 50 MG tablet Take 50 mg by mouth 4 (four) times daily.     [name] HYDROcodone-acetaminophen (NORCO) 5-325 MG tablet Take 1 tablet by mouth every 6 (six) hours as needed. 8 tablet 0  . Lactase (LACTAID PO) Take 2 tablets by mouth daily as needed (consuming milk).     . rosuvastatin (CRESTOR) 20 MG tablet Take 20 mg by mouth every Monday.     . timolol (TIMOPTIC) 0.5 % ophthalmic solution Place 1 drop into both eyes daily.     . Timolol Maleate PF 0.5 % SOLN Place 1  drop into both eyes daily.     No current facility-administered medications for this visit.     ROS:  See HPI  Physical Exam:  The patient is alert and oriented x4  Incision: Right forearm incision is well-healed Extremities:  There is a palpable is pulse.  Motor and sensory are in tact.  There is a thrill/bruit present.  There appeared to be several branches that are possibly filling.  Dialysis Duplex on 12/20/2019: Diameter:  0.35 Depth:  0.50   Assessment/Plan:  This is a 59 y.o. male who is s/p: right radial cephalic AV fistula on 0/12/5463 by Dr. Donzetta Matters.     -the pt does does not have evidence of steal. -the fistula is superficial but not yet of a diameter amenable to access.  We discussed that if he would need to begin hemodialysis in the near future, he would require placement of tunneled hemodialysis catheter.  We will follow-up in 2 months with repeat duplex to assess vein diameter.  I explained to him and his wife that he may require ligation of branches if these appear to interfere with vein maturity.  Their questions are answered and they verbalized understanding    Barbie Banner, PA-C [facility]  Rafael Gonzalez:  Oneida Alar

## 2019-12-20 ENCOUNTER — Ambulatory Visit (INDEPENDENT_AMBULATORY_CARE_PROVIDER_SITE_OTHER): Payer: Self-pay | Admitting: Physician Assistant

## 2019-12-20 ENCOUNTER — Ambulatory Visit (HOSPITAL_COMMUNITY)
Admission: RE | Admit: 2019-12-20 | Discharge: 2019-12-20 | Disposition: A | Discharge status: Home / Self Care | Payer: Medicare PPO | Source: Ambulatory Visit | Attending: Vascular Surgery | Admitting: Vascular Surgery

## 2019-12-20 ENCOUNTER — Other Ambulatory Visit: Payer: Self-pay

## 2019-12-20 VITALS — BP 153/83 | HR 61 | Temp 97.0°F | Resp 16 | Ht 72.0 in | Wt 189.7 lb

## 2019-12-20 DIAGNOSIS — N186 End stage renal disease: Secondary | ICD-10-CM | POA: Insufficient documentation

## 2019-12-20 DIAGNOSIS — Z992 Dependence on renal dialysis: Secondary | ICD-10-CM

## 2019-12-21 ENCOUNTER — Other Ambulatory Visit: Payer: Self-pay | Admitting: *Deleted

## 2019-12-21 ENCOUNTER — Ambulatory Visit (HOSPITAL_COMMUNITY): Payer: Medicare PPO

## 2019-12-21 DIAGNOSIS — N186 End stage renal disease: Secondary | ICD-10-CM

## 2020-01-03 DIAGNOSIS — R21 Rash and other nonspecific skin eruption: Secondary | ICD-10-CM | POA: Diagnosis not present

## 2020-01-03 DIAGNOSIS — N19 Unspecified kidney failure: Secondary | ICD-10-CM | POA: Diagnosis not present

## 2020-01-03 DIAGNOSIS — E1022 Type 1 diabetes mellitus with diabetic chronic kidney disease: Secondary | ICD-10-CM | POA: Diagnosis not present

## 2020-01-03 DIAGNOSIS — N185 Chronic kidney disease, stage 5: Secondary | ICD-10-CM | POA: Diagnosis not present

## 2020-01-03 DIAGNOSIS — E1121 Type 2 diabetes mellitus with diabetic nephropathy: Secondary | ICD-10-CM | POA: Diagnosis not present

## 2020-01-03 DIAGNOSIS — I120 Hypertensive chronic kidney disease with stage 5 chronic kidney disease or end stage renal disease: Secondary | ICD-10-CM | POA: Diagnosis not present

## 2020-01-03 DIAGNOSIS — L298 Other pruritus: Secondary | ICD-10-CM | POA: Diagnosis not present

## 2020-01-09 ENCOUNTER — Other Ambulatory Visit: Payer: Self-pay

## 2020-01-09 DIAGNOSIS — L309 Dermatitis, unspecified: Secondary | ICD-10-CM | POA: Diagnosis not present

## 2020-01-09 DIAGNOSIS — L280 Lichen simplex chronicus: Secondary | ICD-10-CM | POA: Diagnosis not present

## 2020-01-09 DIAGNOSIS — L819 Disorder of pigmentation, unspecified: Secondary | ICD-10-CM | POA: Diagnosis not present

## 2020-01-09 DIAGNOSIS — D485 Neoplasm of uncertain behavior of skin: Secondary | ICD-10-CM | POA: Diagnosis not present

## 2020-01-16 DIAGNOSIS — Z949 Transplanted organ and tissue status, unspecified: Secondary | ICD-10-CM | POA: Diagnosis not present

## 2020-01-31 DIAGNOSIS — Z949 Transplanted organ and tissue status, unspecified: Secondary | ICD-10-CM | POA: Diagnosis not present

## 2020-02-07 DIAGNOSIS — Z794 Long term (current) use of insulin: Secondary | ICD-10-CM | POA: Diagnosis not present

## 2020-02-07 DIAGNOSIS — N184 Chronic kidney disease, stage 4 (severe): Secondary | ICD-10-CM | POA: Diagnosis not present

## 2020-02-07 DIAGNOSIS — E1040 Type 1 diabetes mellitus with diabetic neuropathy, unspecified: Secondary | ICD-10-CM | POA: Diagnosis not present

## 2020-02-07 DIAGNOSIS — I129 Hypertensive chronic kidney disease with stage 1 through stage 4 chronic kidney disease, or unspecified chronic kidney disease: Secondary | ICD-10-CM | POA: Diagnosis not present

## 2020-02-07 DIAGNOSIS — R21 Rash and other nonspecific skin eruption: Secondary | ICD-10-CM | POA: Diagnosis not present

## 2020-02-07 DIAGNOSIS — Z4681 Encounter for fitting and adjustment of insulin pump: Secondary | ICD-10-CM | POA: Diagnosis not present

## 2020-02-13 DIAGNOSIS — E1040 Type 1 diabetes mellitus with diabetic neuropathy, unspecified: Secondary | ICD-10-CM | POA: Diagnosis not present

## 2020-02-21 ENCOUNTER — Ambulatory Visit: Payer: Medicare PPO

## 2020-02-21 ENCOUNTER — Encounter (HOSPITAL_COMMUNITY): Payer: Medicare PPO

## 2020-02-25 ENCOUNTER — Other Ambulatory Visit: Payer: Self-pay

## 2020-02-25 ENCOUNTER — Ambulatory Visit (HOSPITAL_COMMUNITY)
Admission: RE | Admit: 2020-02-25 | Discharge: 2020-02-25 | Disposition: A | Discharge status: Home / Self Care | Payer: Medicare PPO | Source: Ambulatory Visit | Attending: Physician Assistant | Admitting: Physician Assistant

## 2020-02-25 ENCOUNTER — Ambulatory Visit (INDEPENDENT_AMBULATORY_CARE_PROVIDER_SITE_OTHER): Payer: Medicare PPO | Admitting: Physician Assistant

## 2020-02-25 VITALS — BP 138/75 | HR 62 | Temp 97.9°F | Resp 18 | Ht 72.0 in | Wt 185.0 lb

## 2020-02-25 DIAGNOSIS — N186 End stage renal disease: Secondary | ICD-10-CM | POA: Insufficient documentation

## 2020-02-25 DIAGNOSIS — Z992 Dependence on renal dialysis: Secondary | ICD-10-CM | POA: Diagnosis not present

## 2020-02-25 DIAGNOSIS — L720 Epidermal cyst: Secondary | ICD-10-CM | POA: Diagnosis not present

## 2020-02-25 DIAGNOSIS — L819 Disorder of pigmentation, unspecified: Secondary | ICD-10-CM | POA: Diagnosis not present

## 2020-02-25 DIAGNOSIS — D229 Melanocytic nevi, unspecified: Secondary | ICD-10-CM | POA: Diagnosis not present

## 2020-02-25 DIAGNOSIS — L814 Other melanin hyperpigmentation: Secondary | ICD-10-CM | POA: Diagnosis not present

## 2020-02-25 DIAGNOSIS — D1801 Hemangioma of skin and subcutaneous tissue: Secondary | ICD-10-CM | POA: Diagnosis not present

## 2020-02-25 NOTE — Progress Notes (Signed)
Office Note     CC:  follow up Requesting Provider:  Burnard Bunting, MD  HPI: Dillon Lane is a 59 y.o. (Oct 14, 1960) male who presents for follow up of radial cephalic AV fistula. His fistula was placed on 11/08/19 by Dr. Donzetta Matters. He is not yet requiring Hemodialysis. He has hopes of doing HD at home if he has to start dialysis in the near future, otherwise he is currently undergoing testing to see if he is a candidate for kidney/pancreas transplant. He does not have any signs/symptoms of steal.  The pt is on a statin for cholesterol management.  The pt is on a daily aspirin.   Other AC: none The pt is on hydralazine and furosemide for hypertension.   The pt is diabetic. On Humalog Tobacco hx:  Never smoker  Past Medical History:  Diagnosis Date  . Allergy    fall  . Cancer [facility])    pt states he has never been diagnosed   . Chronic kidney disease    stage 4  . Depression    05-2016- no meds   . Diabetes mellitus without complication ([place])    DM type 1, on insulin pump.  . Glaucoma   . Glaucoma   . Hyperlipidemia   . Hypertension   . OSA on CPAP   . Osteoporosis   . PONV (postoperative nausea and vomiting)    1 time got nauseated, then passed out (one of the eye surgeries)  . Retinopathy    20/200 in right eye  . Sleep apnea    wears cpap  . Syncope and collapse    states he can cough and pass out    Past Surgical History:  Procedure Laterality Date  . AV FISTULA PLACEMENT Right 11/08/2019   Procedure: Creation of RIGHT ARM Radiocephalic ARTERIOVENOUS (AV) FISTULA;  Surgeon: Waynetta Sandy, MD;  Location: [facility];  Service: Vascular;  Laterality: Right;  . CATARACT EXTRACTION  1994   left eye  . COLONOSCOPY    . DG BARIUM SWALLOW ([place] HX)  1994  . EYE SURGERY     x3 with 9 sessions of lasers for retinopathy.  . glaucoma surgery     Dr Maylene Roes  . HAND SURGERY  2 years ago   x2  . POLYPECTOMY    . VITRECTOMY     x3    Social History   Socioeconomic  History  . Marital status: Married    Spouse name: Not on file  . Number of children: 2  . Years of education: HS  . Highest education level: Not on file  Occupational History  . Occupation: Disability   Tobacco Use  . Smoking status: Never Smoker  . Smokeless tobacco: Never Used  Substance and Sexual Activity  . Alcohol use: No  . Drug use: No  . Sexual activity: Not on file  Other Topics Concern  . Not on file  Social History Narrative   Denies caffeine use    Social Determinants of Health   Financial Resource Strain:   . Difficulty of Paying Living Expenses:   Food Insecurity:   . Worried About Charity fundraiser in the Last Year:   . Arboriculturist in the Last Year:   Transportation Needs:   . Film/video editor (Medical):   [name] Lack of Transportation (Non-Medical):   Physical Activity:   . Days of Exercise per Week:   . Minutes of Exercise per Session:   Stress:   .  Feeling of Stress :   Social Connections:   . Frequency of Communication with Friends and Family:   . Frequency of Social Gatherings with Friends and Family:   . Attends Religious Services:   . Active Member of Clubs or Organizations:   . Attends Archivist Meetings:   [name] Marital Status:   Intimate Partner Violence:   . Fear of Current or Ex-Partner:   . Emotionally Abused:   [name] Physically Abused:   . Sexually Abused:    Family History  Problem Relation Age of Onset  . Brain cancer Mother   . Prostate cancer Father   . Colon cancer Neg Hx   . Colon polyps Neg Hx   . Rectal cancer Neg Hx   . Stomach cancer Neg Hx   . Esophageal cancer Neg Hx     Current Outpatient Medications  Medication Sig Dispense Refill  . AMLODIPINE BENZOATE PO Take 5 mg by mouth every evening.    [name] aspirin 81 MG tablet Take 81 mg by mouth daily.    . bimatoprost (LUMIGAN) 0.01 % SOLN Place 1 drop into both eyes at bedtime.    . Carboxymethylcellul-Glycerin (LUBRICATING EYE DROPS OP) Place 1 drop into  the right eye daily as needed (dry eyes).    . cetirizine (ZYRTEC) 10 MG tablet Take 10 mg by mouth daily as needed for allergies.     . Cholecalciferol (VITAMIN D) 50 MCG (2000 UT) tablet Take 2,000 Units by mouth daily.    . Colloidal Oatmeal (GOLD BOND ECZEMA RELIEF EX) Apply 1 application topically daily as needed (eczema).    . Emollient (UDDERLY SMOOTH) CREA Apply 1 application topically daily as needed (itching).    . furosemide (LASIX) 40 MG tablet Take 40 mg by mouth daily.    [name] HUMALOG 100 UNIT/ML injection Inject 45-50 Units into the skin daily. Uses in insulin pump averages about 45-50 units per day  12  . hydrALAZINE (APRESOLINE) 50 MG tablet Take 50 mg by mouth 3 (three) times daily.     . Lactase (LACTAID PO) Take 2 tablets by mouth daily as needed (consuming milk).     . rosuvastatin (CRESTOR) 20 MG tablet Take 20 mg by mouth every Monday.     . timolol (TIMOPTIC) 0.5 % ophthalmic solution Place 1 drop into both eyes daily.     . Timolol Maleate PF 0.5 % SOLN Place 1 drop into both eyes daily.    [name] HYDROcodone-acetaminophen (NORCO) 5-325 MG tablet Take 1 tablet by mouth every 6 (six) hours as needed. (Patient not taking: Reported on 02/25/2020) 8 tablet 0   No current facility-administered medications for this visit.    Allergies  Allergen Reactions  . Tetracycline Shortness Of Breath    "Swelling"  . Tetracyclines & Related Shortness Of Breath and Swelling  . Milk-Related Compounds Diarrhea    Bloating, cramping  . Nsaids     Due to poor kidney function     REVIEW OF SYSTEMS:  [X]  denotes positive finding, [ ]  denotes negative finding Cardiac  Comments:  Chest pain or chest pressure:    Shortness of breath upon exertion:    Short of breath when lying flat:    Irregular heart rhythm:        Vascular    Pain in calf, thigh, or hip brought on by ambulation:    Pain in feet at night that wakes you up from your sleep:     Blood clot in  your veins:    Leg swelling:          Pulmonary    Oxygen at home:    Productive cough:     Wheezing:         Neurologic    Sudden weakness in arms or legs:     Sudden numbness in arms or legs:     Sudden onset of difficulty speaking or slurred speech:    Temporary loss of vision in one eye:     Problems with dizziness:         Gastrointestinal    Blood in stool:     Vomited blood:         Genitourinary    Burning when urinating:     Blood in urine:        Psychiatric    Major depression:         Hematologic    Bleeding problems:    Problems with blood clotting too easily:        Skin    Rashes or ulcers:        Constitutional    Fever or chills:      PHYSICAL EXAMINATION:  Vitals:   02/25/20 1116  BP: 138/75  Pulse: 62  Resp: 18  Temp: 97.9 F (36.6 C)  TempSrc: Temporal  SpO2: 100%  Weight: 185 lb (83.9 kg)  Height: 6' (1.829 m)    General:  WDWN in NAD; vital signs documented above Gait: Not observed HENT: WNL, normocephalic Pulmonary: normal non-labored breathing , without Rales, rhonchi,  wheezing Cardiac: regular HR, without  Murmurs without carotid bruit Abdomen: soft, NT, no masses Skin: without rashes Vascular Exam/Pulses: right radial cephalic av fistula healed. Good thrill and bruit. Easily palpable in distal right arm. No tenderness. No swelling. Right hand warm. 5/5 grip strength  Right Left  Radial 2+ (normal) 2+ (normal)  Ulnar 2+ (normal) 2+ (normal)  Extremities: without ischemic changes, without Gangrene , without cellulitis; without open wounds;  Musculoskeletal: no muscle wasting or atrophy  Neurologic: A&O X 3;  No focal weakness or paresthesias are detected Psychiatric:  The pt has Normal affect.   Non-Invasive Vascular Imaging:    +------------+----------+-------------+----------+----------------+  OUTFLOW VEINPSV (cm/s)Diameter (cm)Depth (cm)  Describe    +------------+----------+-------------+----------+----------------+  AC Fossa     150    0.50     0.28  competing branch  +------------+----------+-------------+----------+----------------+  Prox Forearm  88    0.40     0.35            +------------+----------+-------------+----------+----------------+  Mid Forearm   141    0.44     0.21            +------------+----------+-------------+----------+----------------+  Dist Forearm  141    0.49     0.17  competing branch  +------------+----------+-------------+----------+----------------+   Fistula is still not of ideal diameter for access, it has matured more since initial post op evaluation  ASSESSMENT/PLAN:: 59 y.o. male here for follow up s/p right radial cephalic AV fistula on 04/03/05 by Dr. Donzetta Matters. Fistula is working well with good bruit and thrill. He has no signs/symptoms of steal. The fistula diameter on non invasive studies is still smaller than we would like however clinically I feel like it is accessible for dialysis if hemodialysis is initiated. I have encouraged him to continue to exercise the right arm. He currently still has not started dialysis. He has follow up with Dr. Joelyn Oms in June. He is currently undergoing Transplant  evaluation at [facility]. His fistula can be accessed at any time now if needed. I have discussed with the patient that if he starts hemodialysis and has any issues with the fistula to follow up at that time. Otherwise he can follow up on an as needed basis   Karoline Caldwell, PA-C [facility] [phone]  Clinic MD:   Dr. Trula Slade

## 2020-02-28 DIAGNOSIS — M65322 Trigger finger, left index finger: Secondary | ICD-10-CM | POA: Diagnosis not present

## 2020-02-28 DIAGNOSIS — Z4789 Encounter for other orthopedic aftercare: Secondary | ICD-10-CM | POA: Diagnosis not present

## 2020-03-05 DIAGNOSIS — N185 Chronic kidney disease, stage 5: Secondary | ICD-10-CM | POA: Diagnosis not present

## 2020-03-05 DIAGNOSIS — E1121 Type 2 diabetes mellitus with diabetic nephropathy: Secondary | ICD-10-CM | POA: Diagnosis not present

## 2020-03-05 DIAGNOSIS — E1022 Type 1 diabetes mellitus with diabetic chronic kidney disease: Secondary | ICD-10-CM | POA: Diagnosis not present

## 2020-03-05 DIAGNOSIS — I770 Arteriovenous fistula, acquired: Secondary | ICD-10-CM | POA: Diagnosis not present

## 2020-03-05 DIAGNOSIS — L298 Other pruritus: Secondary | ICD-10-CM | POA: Diagnosis not present

## 2020-03-05 DIAGNOSIS — I120 Hypertensive chronic kidney disease with stage 5 chronic kidney disease or end stage renal disease: Secondary | ICD-10-CM | POA: Diagnosis not present

## 2020-03-13 ENCOUNTER — Other Ambulatory Visit: Payer: Self-pay | Admitting: *Deleted

## 2020-03-13 DIAGNOSIS — R0989 Other specified symptoms and signs involving the circulatory and respiratory systems: Secondary | ICD-10-CM

## 2020-03-14 DIAGNOSIS — M79645 Pain in left finger(s): Secondary | ICD-10-CM | POA: Diagnosis not present

## 2020-03-18 ENCOUNTER — Other Ambulatory Visit: Payer: Self-pay | Admitting: *Deleted

## 2020-03-18 NOTE — Progress Notes (Signed)
Opened chart in error.

## 2020-03-19 DIAGNOSIS — E1040 Type 1 diabetes mellitus with diabetic neuropathy, unspecified: Secondary | ICD-10-CM | POA: Diagnosis not present

## 2020-04-01 DIAGNOSIS — E1040 Type 1 diabetes mellitus with diabetic neuropathy, unspecified: Secondary | ICD-10-CM | POA: Diagnosis not present

## 2020-04-14 DIAGNOSIS — Z949 Transplanted organ and tissue status, unspecified: Secondary | ICD-10-CM | POA: Diagnosis not present

## 2020-04-22 DIAGNOSIS — E041 Nontoxic single thyroid nodule: Secondary | ICD-10-CM | POA: Diagnosis not present

## 2020-04-22 DIAGNOSIS — Z949 Transplanted organ and tissue status, unspecified: Secondary | ICD-10-CM | POA: Diagnosis not present

## 2020-04-22 DIAGNOSIS — K769 Liver disease, unspecified: Secondary | ICD-10-CM | POA: Diagnosis not present

## 2020-04-28 DIAGNOSIS — N185 Chronic kidney disease, stage 5: Secondary | ICD-10-CM | POA: Diagnosis not present

## 2020-04-29 DIAGNOSIS — N185 Chronic kidney disease, stage 5: Secondary | ICD-10-CM | POA: Diagnosis not present

## 2020-05-07 DIAGNOSIS — E1022 Type 1 diabetes mellitus with diabetic chronic kidney disease: Secondary | ICD-10-CM | POA: Diagnosis not present

## 2020-05-07 DIAGNOSIS — E1122 Type 2 diabetes mellitus with diabetic chronic kidney disease: Secondary | ICD-10-CM | POA: Diagnosis not present

## 2020-05-07 DIAGNOSIS — N185 Chronic kidney disease, stage 5: Secondary | ICD-10-CM | POA: Diagnosis not present

## 2020-05-07 DIAGNOSIS — I120 Hypertensive chronic kidney disease with stage 5 chronic kidney disease or end stage renal disease: Secondary | ICD-10-CM | POA: Diagnosis not present

## 2020-05-07 DIAGNOSIS — I770 Arteriovenous fistula, acquired: Secondary | ICD-10-CM | POA: Diagnosis not present

## 2020-05-13 DIAGNOSIS — Z949 Transplanted organ and tissue status, unspecified: Secondary | ICD-10-CM | POA: Diagnosis not present

## 2020-05-20 ENCOUNTER — Encounter (INDEPENDENT_AMBULATORY_CARE_PROVIDER_SITE_OTHER): Payer: Self-pay | Admitting: Ophthalmology

## 2020-05-20 ENCOUNTER — Ambulatory Visit (INDEPENDENT_AMBULATORY_CARE_PROVIDER_SITE_OTHER): Payer: Medicare PPO | Admitting: Ophthalmology

## 2020-05-20 ENCOUNTER — Other Ambulatory Visit: Payer: Self-pay

## 2020-05-20 DIAGNOSIS — H35372 Puckering of macula, left eye: Secondary | ICD-10-CM | POA: Insufficient documentation

## 2020-05-20 DIAGNOSIS — H2701 Aphakia, right eye: Secondary | ICD-10-CM

## 2020-05-20 DIAGNOSIS — E103553 Type 1 diabetes mellitus with stable proliferative diabetic retinopathy, bilateral: Secondary | ICD-10-CM | POA: Insufficient documentation

## 2020-05-20 NOTE — Assessment & Plan Note (Signed)

## 2020-05-20 NOTE — Progress Notes (Signed)
05/20/2020     CHIEF COMPLAINT Patient presents for Retina Follow Up   HISTORY OF PRESENT ILLNESS: Dillon Lane is a 59 y.o. male who presents to the clinic today for:   HPI    Retina Follow Up    Patient presents with  Diabetic Retinopathy.  In both eyes.  This started 9 months ago.  Severity is mild.  Duration of 9 months.  Since onset it is stable.          Comments    9 Month Diabetic F/U OU  Pt reports fluctuating VA OU with blood sugars off and on. Pt denies ocular pain, flashes, or floaters OU. A1c: 7.1, 11/2019 LBS: 125 this AM       Last edited by Rockie Neighbours, [place] on 05/20/2020 10:29 AM. (History)      Referring physician: Burnard Bunting, MD [place],  [place] [address]  HISTORICAL INFORMATION:   Selected notes from the MEDICAL RECORD NUMBER       CURRENT MEDICATIONS: Current Outpatient Medications (Ophthalmic Drugs)  Medication Sig  . bimatoprost (LUMIGAN) 0.01 % SOLN Place 1 drop into both eyes at bedtime.  . Carboxymethylcellul-Glycerin (LUBRICATING EYE DROPS OP) Place 1 drop into the right eye daily as needed (dry eyes).  . timolol (TIMOPTIC) 0.5 % ophthalmic solution Place 1 drop into both eyes daily.   . Timolol Maleate PF 0.5 % SOLN Place 1 drop into both eyes daily.   No current facility-administered medications for this visit. (Ophthalmic Drugs)   Current Outpatient Medications (Other)  Medication Sig  . AMLODIPINE BENZOATE PO Take 5 mg by mouth every evening.  [name] aspirin 81 MG tablet Take 81 mg by mouth daily.  . cetirizine (ZYRTEC) 10 MG tablet Take 10 mg by mouth daily as needed for allergies.   . Cholecalciferol (VITAMIN D) 50 MCG (2000 UT) tablet Take 2,000 Units by mouth daily.  . Colloidal Oatmeal (GOLD BOND ECZEMA RELIEF EX) Apply 1 application topically daily as needed (eczema).  . Emollient (UDDERLY SMOOTH) CREA Apply 1 application topically daily as needed (itching).  . furosemide (LASIX) 40 MG tablet Take 40 mg  by mouth daily.  [name] HUMALOG 100 UNIT/ML injection Inject 45-50 Units into the skin daily. Uses in insulin pump averages about 45-50 units per day  . hydrALAZINE (APRESOLINE) 50 MG tablet Take 50 mg by mouth 3 (three) times daily.   [name] HYDROcodone-acetaminophen (NORCO) 5-325 MG tablet Take 1 tablet by mouth every 6 (six) hours as needed. (Patient not taking: Reported on 02/25/2020)  . Lactase (LACTAID PO) Take 2 tablets by mouth daily as needed (consuming milk).   . rosuvastatin (CRESTOR) 20 MG tablet Take 20 mg by mouth every Monday.    No current facility-administered medications for this visit. (Other)      REVIEW OF SYSTEMS:    ALLERGIES Allergies  Allergen Reactions  . Tetracycline Shortness Of Breath    "Swelling"  . Tetracyclines & Related Shortness Of Breath and Swelling  . Milk-Related Compounds Diarrhea    Bloating, cramping  . Nsaids     Due to poor kidney function    PAST MEDICAL HISTORY Past Medical History:  Diagnosis Date  . Allergy    fall  . Cancer [facility])    pt states he has never been diagnosed   . Chronic kidney disease    stage 4  . Depression    05-2016- no meds   . Diabetes mellitus without complication ([place])  DM type 1, on insulin pump.  . Glaucoma   . Glaucoma   . Hyperlipidemia   . Hypertension   . OSA on CPAP   . Osteoporosis   . PONV (postoperative nausea and vomiting)    1 time got nauseated, then passed out (one of the eye surgeries)  . Retinopathy    20/200 in right eye  . Sleep apnea    wears cpap  . Syncope and collapse    states he can cough and pass out   Past Surgical History:  Procedure Laterality Date  . AV FISTULA PLACEMENT Right 11/08/2019   Procedure: Creation of RIGHT ARM Radiocephalic ARTERIOVENOUS (AV) FISTULA;  Surgeon: Waynetta Sandy, MD;  Location: [place];  Service: Vascular;  Laterality: Right;  . CATARACT EXTRACTION  1994   left eye  . COLONOSCOPY    . DG BARIUM SWALLOW ([place] HX)  1994  . EYE SURGERY      x3 with 9 sessions of lasers for retinopathy.  . glaucoma surgery     Dr Maylene Roes  . HAND SURGERY  2 years ago   x2  . POLYPECTOMY    . VITRECTOMY     x3    FAMILY HISTORY Family History  Problem Relation Age of Onset  . Brain cancer Mother   . Prostate cancer Father   . Colon cancer Neg Hx   . Colon polyps Neg Hx   . Rectal cancer Neg Hx   . Stomach cancer Neg Hx   . Esophageal cancer Neg Hx     SOCIAL HISTORY Social History   Tobacco Use  . Smoking status: Never Smoker  . Smokeless tobacco: Never Used  Vaping Use  . Vaping Use: Never used  Substance Use Topics  . Alcohol use: No  . Drug use: No         OPHTHALMIC EXAM:  Base Eye Exam    Visual Acuity (ETDRS)      Right Left   Dist cc 20/200 20/20 -1   Dist ph cc 20/200 +1    Correction: Glasses, Contacts       Tonometry (Tonopen, 10:32 AM)      Right Left   Pressure 15 16       Pupils      Dark Light Shape React APD   Right 2 2 Irregular Minimal None   Left 2 2 Round Minimal None       Visual Fields (Counting fingers)      Left Right   Restrictions Total superior temporal, inferior temporal, superior nasal, inferior nasal deficiencies Total superior temporal, inferior temporal, superior nasal, inferior nasal deficiencies       Extraocular Movement      Right Left    Full Full       Neuro/Psych    Oriented x3: Yes   Mood/Affect: Normal       Dilation    Both eyes: 1.0% Mydriacyl, 2.5% Phenylephrine @ 10:36 AM        Slit Lamp and Fundus Exam    External Exam      Right Left   External Normal Normal       Slit Lamp Exam      Right Left   Lids/Lashes Normal Normal   Conjunctiva/Sclera White and quiet White and quiet   Cornea Clear Clear   Anterior Chamber Deep and quiet Deep and quiet   Iris Irregular pupil Round and reactive   Lens Aphakia Posterior chamber intraocular lens  Anterior Vitreous Normal Normal       Fundus Exam      Right Left   Posterior Vitreous  Vitrectomized Vitrectomized   Disc Pallor 1+ Pallor 1+   C/D Ratio 0.55 0.55   Macula Geographic atrophy small central region in the fovea, residual from prior resolved CSME decades previous Geographic atrophy small central region in the fovea, residual from prior resolved CSME decades previous   Vessels PDR-quiet PDR-quiet   Periphery Good PRP peripherally, with a "lesion creep" expansion of laser chorioretinal scars Good PRP peripherally, with a "lesion creep" expansion of laser chorioretinal scars          IMAGING AND PROCEDURES  Imaging and Procedures for 05/20/20  OCT, Retina - OU - Both Eyes       Right Eye Quality was good. Scan locations included subfoveal. Central Foveal Thickness: 221. Progression has been stable. Findings include outer retinal atrophy, central retinal atrophy.   Left Eye Quality was good. Scan locations included subfoveal. Central Foveal Thickness: 296. Progression has been stable.   Notes No change in OCT over time                ASSESSMENT/PLAN:  Controlled type 1 diabetes mellitus with stable proliferative retinopathy of both eyes ([place]) The nature of regressed proliferative diabetic retinopathy was discussed with the patient. The patient was advised to maintain good glucose, blood pressure, monitor kidney function and serum lipid control as advised by personal physician. Rare risk for reactivation of progression exist with untreated severe anemia, untreated renal failure, untreated heart failure, and smoking. Complete avoidance of smoking was recommended. The chance of recurrent proliferative diabetic retinopathy was discussed as well as the chance of vitreous hemorrhage for which further treatments may be necessary.   Explained to the patient that the quiescent  proliferative diabetic retinopathy disease is unlikely to ever worsen.  Worsening factors would include however severe anemia, hypertension out-of-control or impending renal failure.       ICD-10-CM   1. Controlled type 1 diabetes mellitus with stable proliferative retinopathy of both eyes (HCC)  E10.3553 OCT, Retina - OU - Both Eyes  2. Left epiretinal membrane  H35.372 OCT, Retina - OU - Both Eyes  3. Aphakic open-angle glaucoma of right eye  H40.10X0    H27.01     1.  2.  3.  Ophthalmic Meds Ordered this visit:  No orders of the defined types were placed in this encounter.      Return in about 9 months (around 02/17/2021) for DILATE OU, COLOR FP.  There are no Patient Instructions on file for this visit.   Explained the diagnoses, plan, and follow up with the patient and they expressed understanding.  Patient expressed understanding of the importance of proper follow up care.   Clent Demark Bobbyjo Marulanda M.D. Diseases & Surgery of the Retina and Vitreous [facility] [place] 05/20/20     Abbreviations: M myopia (nearsighted); A astigmatism; H hyperopia (farsighted); P presbyopia; Mrx spectacle prescription;  CTL contact lenses; OD right eye; OS left eye; OU both eyes  XT exotropia; ET esotropia; PEK punctate epithelial keratitis; PEE punctate epithelial erosions; DES dry eye syndrome; MGD meibomian gland dysfunction; ATs artificial tears; PFAT's preservative free artificial tears; [place] nuclear sclerotic cataract; PSC posterior subcapsular cataract; ERM epi-retinal membrane; PVD posterior vitreous detachment; RD retinal detachment; DM diabetes mellitus; DR diabetic retinopathy; NPDR non-proliferative diabetic retinopathy; PDR proliferative diabetic retinopathy; CSME clinically significant macular edema; DME diabetic macular edema; dbh  dot blot hemorrhages; CWS cotton wool spot; POAG primary open angle glaucoma; C/D cup-to-disc ratio; HVF humphrey visual field; GVF goldmann visual field; OCT optical coherence tomography; IOP intraocular pressure; BRVO Branch retinal vein occlusion; CRVO central retinal vein occlusion; CRAO central retinal artery occlusion; BRAO  branch retinal artery occlusion; RT retinal tear; SB scleral buckle; PPV pars plana vitrectomy; VH Vitreous hemorrhage; PRP panretinal laser photocoagulation; IVK intravitreal kenalog; VMT vitreomacular traction; MH Macular hole;  NVD neovascularization of the disc; NVE neovascularization elsewhere; AREDS age related eye disease study; ARMD age related macular degeneration; POAG primary open angle glaucoma; EBMD epithelial/anterior basement membrane dystrophy; ACIOL anterior chamber intraocular lens; IOL intraocular lens; PCIOL posterior chamber intraocular lens; Phaco/IOL phacoemulsification with intraocular lens placement; [place] photorefractive keratectomy; LASIK laser assisted in situ keratomileusis; HTN hypertension; DM diabetes mellitus; COPD chronic obstructive pulmonary disease

## 2020-06-02 DIAGNOSIS — K7689 Other specified diseases of liver: Secondary | ICD-10-CM | POA: Diagnosis not present

## 2020-06-02 DIAGNOSIS — K8689 Other specified diseases of pancreas: Secondary | ICD-10-CM | POA: Diagnosis not present

## 2020-06-02 DIAGNOSIS — Z949 Transplanted organ and tissue status, unspecified: Secondary | ICD-10-CM | POA: Diagnosis not present

## 2020-06-04 DIAGNOSIS — Z794 Long term (current) use of insulin: Secondary | ICD-10-CM | POA: Diagnosis not present

## 2020-06-04 DIAGNOSIS — E1040 Type 1 diabetes mellitus with diabetic neuropathy, unspecified: Secondary | ICD-10-CM | POA: Diagnosis not present

## 2020-06-04 DIAGNOSIS — I10 Essential (primary) hypertension: Secondary | ICD-10-CM | POA: Diagnosis not present

## 2020-06-04 DIAGNOSIS — E10319 Type 1 diabetes mellitus with unspecified diabetic retinopathy without macular edema: Secondary | ICD-10-CM | POA: Diagnosis not present

## 2020-06-04 DIAGNOSIS — E1029 Type 1 diabetes mellitus with other diabetic kidney complication: Secondary | ICD-10-CM | POA: Diagnosis not present

## 2020-06-04 DIAGNOSIS — E103553 Type 1 diabetes mellitus with stable proliferative diabetic retinopathy, bilateral: Secondary | ICD-10-CM | POA: Diagnosis not present

## 2020-06-04 DIAGNOSIS — E1039 Type 1 diabetes mellitus with other diabetic ophthalmic complication: Secondary | ICD-10-CM | POA: Diagnosis not present

## 2020-06-04 DIAGNOSIS — Z20828 Contact with and (suspected) exposure to other viral communicable diseases: Secondary | ICD-10-CM | POA: Diagnosis not present

## 2020-06-30 DIAGNOSIS — H60332 Swimmer's ear, left ear: Secondary | ICD-10-CM | POA: Diagnosis not present

## 2020-07-03 DIAGNOSIS — E119 Type 2 diabetes mellitus without complications: Secondary | ICD-10-CM | POA: Diagnosis not present

## 2020-07-03 DIAGNOSIS — H5202 Hypermetropia, left eye: Secondary | ICD-10-CM | POA: Diagnosis not present

## 2020-07-03 DIAGNOSIS — H401131 Primary open-angle glaucoma, bilateral, mild stage: Secondary | ICD-10-CM | POA: Diagnosis not present

## 2020-07-03 DIAGNOSIS — Z961 Presence of intraocular lens: Secondary | ICD-10-CM | POA: Diagnosis not present

## 2020-07-21 ENCOUNTER — Encounter: Payer: Self-pay | Admitting: Adult Health

## 2020-07-23 ENCOUNTER — Telehealth (INDEPENDENT_AMBULATORY_CARE_PROVIDER_SITE_OTHER): Payer: Medicare PPO | Admitting: Adult Health

## 2020-07-23 DIAGNOSIS — G4733 Obstructive sleep apnea (adult) (pediatric): Secondary | ICD-10-CM | POA: Diagnosis not present

## 2020-07-23 DIAGNOSIS — Z9989 Dependence on other enabling machines and devices: Secondary | ICD-10-CM | POA: Diagnosis not present

## 2020-07-23 DIAGNOSIS — N185 Chronic kidney disease, stage 5: Secondary | ICD-10-CM | POA: Diagnosis not present

## 2020-07-23 NOTE — Progress Notes (Signed)
PATIENT: Dillon Lane DOB: April 21, 1961  REASON FOR VISIT: follow up HISTORY FROM: patient  Virtual Visit via Video Note  I connected with [name] on 07/23/20 at  1:00 PM EDT by a video enabled telemedicine application located remotely at [facility] and verified that I am speaking with the correct person using two identifiers who was located at their own home.   I discussed the limitations of evaluation and management by telemedicine and the availability of in person appointments. The patient expressed understanding and agreed to proceed.   PATIENT: Dillon Lane DOB: 07-Jul-1961  REASON FOR VISIT: follow up HISTORY FROM: patient  HISTORY OF PRESENT ILLNESS: Today 07/23/20:  [name] is a 59 year old male with a history of OSA on CPAP. He returns today for follow-up. Download indicates that he used machine 24/30 days for compliance of 80%. Used machine >4 hours 21/30 days for compliance of 70%. Residual AHI 1.5 on 14cm H20 with EPR 3. Leak in the 95th percentile is 31.5 %. Overall doing well with CPAP. On transplant list for pancreas and kidney. Itching at night which keeps him awake some nights- seeing dermatology.   HISTORY 07/24/19:  [name] is a 59 year old male with a history of obstructive sleep apnea on CPAP.  His download indicates that he uses machine 24 out of 30 days for compliance of 80%.  He uses machine greater than 4 hours 19 days for compliance of 63%.  On average he uses his machine 5 hours and 50 minutes.  His residual AHI is 1.7 on 14 cm of water with EPR of 3.  His leak in the 95th percentile is 25.2 L/min.  He currently has the nasal pillows.  He does not feel his mask leaking at night.  He does not have a set bedtime routine.  He reports that he has been diagnosed with stage IV kidney disease.  Dillon Lane  He returns today for an evaluation.  REVIEW OF SYSTEMS: Out of a complete 14 system review of symptoms, the patient complains only of  the following symptoms, and all other reviewed systems are negative.  See HPI  ALLERGIES: Allergies  Allergen Reactions  . Tetracycline Shortness Of Breath    "Swelling"  . Tetracyclines & Related Shortness Of Breath and Swelling  . Milk-Related Compounds Diarrhea    Bloating, cramping  . Nsaids     Due to poor kidney function    HOME MEDICATIONS: Outpatient Medications Prior to Visit  Medication Sig Dispense Refill  . AMLODIPINE BENZOATE PO Take 5 mg by mouth every evening.    Dillon Lane aspirin 81 MG tablet Take 81 mg by mouth daily.    . bimatoprost (LUMIGAN) 0.01 % SOLN Place 1 drop into both eyes at bedtime.    . Carboxymethylcellul-Glycerin (LUBRICATING EYE DROPS OP) Place 1 drop into the right eye daily as needed (dry eyes).    . cetirizine (ZYRTEC) 10 MG tablet Take 10 mg by mouth daily as needed for allergies.     . Cholecalciferol (VITAMIN D) 50 MCG (2000 UT) tablet Take 2,000 Units by mouth daily.    . Colloidal Oatmeal (GOLD BOND ECZEMA RELIEF EX) Apply 1 application topically daily as needed (eczema).    . Emollient (UDDERLY SMOOTH) CREA Apply 1 application topically daily as needed (itching).    . furosemide (LASIX) 40 MG tablet Take 40 mg by mouth daily.    Dillon Lane HUMALOG 100 UNIT/ML injection Inject 45-50 Units into the  skin daily. Uses in insulin pump averages about 45-50 units per day  12  . hydrALAZINE (APRESOLINE) 50 MG tablet Take 50 mg by mouth 3 (three) times daily.     Dillon Lane HYDROcodone-acetaminophen (NORCO) 5-325 MG tablet Take 1 tablet by mouth every 6 (six) hours as needed. (Patient not taking: Reported on 02/25/2020) 8 tablet 0  . Lactase (LACTAID PO) Take 2 tablets by mouth daily as needed (consuming milk).     . rosuvastatin (CRESTOR) 20 MG tablet Take 20 mg by mouth every Monday.     . timolol (TIMOPTIC) 0.5 % ophthalmic solution Place 1 drop into both eyes daily.     . Timolol Maleate PF 0.5 % SOLN Place 1 drop into both eyes daily.     No facility-administered  medications prior to visit.    PAST MEDICAL HISTORY: Past Medical History:  Diagnosis Date  . Allergy    fall  . Cancer [facility])    pt states he has never been diagnosed   . Chronic kidney disease    stage 4  . Depression    05-2016- no meds   . Diabetes mellitus without complication ([place])    DM type 1, on insulin pump.  . Glaucoma   . Glaucoma   . Hyperlipidemia   . Hypertension   . OSA on CPAP   . Osteoporosis   . PONV (postoperative nausea and vomiting)    1 time got nauseated, then passed out (one of the eye surgeries)  . Retinopathy    20/200 in right eye  . Sleep apnea    wears cpap  . Syncope and collapse    states he can cough and pass out    PAST SURGICAL HISTORY: Past Surgical History:  Procedure Laterality Date  . AV FISTULA PLACEMENT Right 11/08/2019   Procedure: Creation of RIGHT ARM Radiocephalic ARTERIOVENOUS (AV) FISTULA;  Surgeon: Waynetta Sandy, MD;  Location: [place];  Service: Vascular;  Laterality: Right;  . CATARACT EXTRACTION  1994   left eye  . COLONOSCOPY    . DG BARIUM SWALLOW ([place] HX)  1994  . EYE SURGERY     x3 with 9 sessions of lasers for retinopathy.  . glaucoma surgery     Dr Maylene Roes  . HAND SURGERY  2 years ago   x2  . POLYPECTOMY    . VITRECTOMY     x3    FAMILY HISTORY: Family History  Problem Relation Age of Onset  . Brain cancer Mother   . Prostate cancer Father   . Colon cancer Neg Hx   . Colon polyps Neg Hx   . Rectal cancer Neg Hx   . Stomach cancer Neg Hx   . Esophageal cancer Neg Hx     SOCIAL HISTORY: Social History   Socioeconomic History  . Marital status: Married    Spouse name: Not on file  . Number of children: 2  . Years of education: HS  . Highest education level: Not on file  Occupational History  . Occupation: Disability   Tobacco Use  . Smoking status: Never Smoker  . Smokeless tobacco: Never Used  Vaping Use  . Vaping Use: Never used  Substance and Sexual Activity  . Alcohol use: No   . Drug use: No  . Sexual activity: Not on file  Other Topics Concern  . Not on file  Social History Narrative   Denies caffeine use    Social Determinants of Radio broadcast assistant  Strain:   . Difficulty of Paying Living Expenses: Not on file  Food Insecurity:   . Worried About Charity fundraiser in the Last Year: Not on file  . Ran Out of Food in the Last Year: Not on file  Transportation Needs:   . Lack of Transportation (Medical): Not on file  . Lack of Transportation (Non-Medical): Not on file  Physical Activity:   . Days of Exercise per Week: Not on file  . Minutes of Exercise per Session: Not on file  Stress:   . Feeling of Stress : Not on file  Social Connections:   . Frequency of Communication with Friends and Family: Not on file  . Frequency of Social Gatherings with Friends and Family: Not on file  . Attends Religious Services: Not on file  . Active Member of Clubs or Organizations: Not on file  . Attends Archivist Meetings: Not on file  . Marital Status: Not on file  Intimate Partner Violence:   . Fear of Current or Ex-Partner: Not on file  . Emotionally Abused: Not on file  . Physically Abused: Not on file  . Sexually Abused: Not on file      PHYSICAL EXAM Generalized: Well developed, in no acute distress   Neurological examination  Mentation: Alert oriented to time, place, history taking. Follows all commands speech and language fluent Cranial nerve II-XII:Extraocular movements were full. Facial symmetry noted. uvula tongue midline. Head turning and shoulder shrug  were normal and symmetric. Motor: Good strength throughout subjectively per patient Sensory: Sensory testing is intact to soft touch on all 4 extremities subjectively per patient Coordination: Cerebellar testing reveals good finger-nose-finger  Gait and station: Patient is able to stand from a seated position. gait is normal.  Reflexes: UTA  DIAGNOSTIC DATA (LABS, IMAGING,  TESTING) - I reviewed patient records, labs, notes, testing and imaging myself where available.  Lab Results  Component Value Date   HGB 12.9 (L) 11/08/2019   HCT 38.0 (L) 11/08/2019      Component Value Date/Time   NA 131 (L) 11/08/2019 0933   K 4.2 11/08/2019 0933   CL 95 (L) 11/08/2019 0933   GLUCOSE 167 (H) 11/08/2019 0933   BUN 54 (H) 11/08/2019 0933   CREATININE 4.90 (H) 11/08/2019 0933      ASSESSMENT AND PLAN 59 y.o. year old male  has a past medical history of Allergy, Cancer ([place]), Chronic kidney disease, Depression, Diabetes mellitus without complication ([place]), Glaucoma, Glaucoma, Hyperlipidemia, Hypertension, OSA on CPAP, Osteoporosis, PONV (postoperative nausea and vomiting), Retinopathy, Sleep apnea, and Syncope and collapse. here with:  OSA on CPAP  . CPAP compliance excellent . Residual AHI is good . Encouraged patient to continue using CPAP nightly and > 4 hours each night . F/U in 1 year or sooner if needed  I spent 20 minutes of face-to-face and non-face-to-face time with patient.  This included previsit chart review, lab review, study review, order entry, electronic health record documentation, patient education.  Ward Givens, MSN, NP-C 07/23/2020, 1:19 PM [facility] [address] [place], [place] [address] [phone]

## 2020-07-26 DIAGNOSIS — Z23 Encounter for immunization: Secondary | ICD-10-CM | POA: Diagnosis not present

## 2020-07-31 ENCOUNTER — Encounter (INDEPENDENT_AMBULATORY_CARE_PROVIDER_SITE_OTHER): Payer: Self-pay

## 2020-07-31 DIAGNOSIS — E1022 Type 1 diabetes mellitus with diabetic chronic kidney disease: Secondary | ICD-10-CM | POA: Diagnosis not present

## 2020-07-31 DIAGNOSIS — N185 Chronic kidney disease, stage 5: Secondary | ICD-10-CM | POA: Diagnosis not present

## 2020-07-31 DIAGNOSIS — I120 Hypertensive chronic kidney disease with stage 5 chronic kidney disease or end stage renal disease: Secondary | ICD-10-CM | POA: Diagnosis not present

## 2020-07-31 DIAGNOSIS — E1121 Type 2 diabetes mellitus with diabetic nephropathy: Secondary | ICD-10-CM | POA: Diagnosis not present

## 2020-07-31 DIAGNOSIS — I770 Arteriovenous fistula, acquired: Secondary | ICD-10-CM | POA: Diagnosis not present

## 2020-08-05 DIAGNOSIS — E1040 Type 1 diabetes mellitus with diabetic neuropathy, unspecified: Secondary | ICD-10-CM | POA: Diagnosis not present

## 2020-08-18 DIAGNOSIS — E1040 Type 1 diabetes mellitus with diabetic neuropathy, unspecified: Secondary | ICD-10-CM | POA: Diagnosis not present

## 2020-08-25 DIAGNOSIS — G4733 Obstructive sleep apnea (adult) (pediatric): Secondary | ICD-10-CM | POA: Diagnosis not present

## 2020-09-02 DIAGNOSIS — Z4681 Encounter for fitting and adjustment of insulin pump: Secondary | ICD-10-CM | POA: Diagnosis not present

## 2020-09-02 DIAGNOSIS — I129 Hypertensive chronic kidney disease with stage 1 through stage 4 chronic kidney disease, or unspecified chronic kidney disease: Secondary | ICD-10-CM | POA: Diagnosis not present

## 2020-09-02 DIAGNOSIS — N184 Chronic kidney disease, stage 4 (severe): Secondary | ICD-10-CM | POA: Diagnosis not present

## 2020-09-02 DIAGNOSIS — Z794 Long term (current) use of insulin: Secondary | ICD-10-CM | POA: Diagnosis not present

## 2020-09-02 DIAGNOSIS — E1029 Type 1 diabetes mellitus with other diabetic kidney complication: Secondary | ICD-10-CM | POA: Diagnosis not present

## 2020-09-08 DIAGNOSIS — N185 Chronic kidney disease, stage 5: Secondary | ICD-10-CM | POA: Diagnosis not present

## 2020-09-30 DIAGNOSIS — I770 Arteriovenous fistula, acquired: Secondary | ICD-10-CM | POA: Diagnosis not present

## 2020-09-30 DIAGNOSIS — I120 Hypertensive chronic kidney disease with stage 5 chronic kidney disease or end stage renal disease: Secondary | ICD-10-CM | POA: Diagnosis not present

## 2020-09-30 DIAGNOSIS — N185 Chronic kidney disease, stage 5: Secondary | ICD-10-CM | POA: Diagnosis not present

## 2020-09-30 DIAGNOSIS — E1022 Type 1 diabetes mellitus with diabetic chronic kidney disease: Secondary | ICD-10-CM | POA: Diagnosis not present

## 2020-09-30 DIAGNOSIS — E1121 Type 2 diabetes mellitus with diabetic nephropathy: Secondary | ICD-10-CM | POA: Diagnosis not present

## 2020-10-13 DIAGNOSIS — M810 Age-related osteoporosis without current pathological fracture: Secondary | ICD-10-CM | POA: Diagnosis not present

## 2020-10-13 DIAGNOSIS — I10 Essential (primary) hypertension: Secondary | ICD-10-CM | POA: Diagnosis not present

## 2020-10-13 DIAGNOSIS — E1029 Type 1 diabetes mellitus with other diabetic kidney complication: Secondary | ICD-10-CM | POA: Diagnosis not present

## 2020-10-13 DIAGNOSIS — Z125 Encounter for screening for malignant neoplasm of prostate: Secondary | ICD-10-CM | POA: Diagnosis not present

## 2020-10-22 DIAGNOSIS — I129 Hypertensive chronic kidney disease with stage 1 through stage 4 chronic kidney disease, or unspecified chronic kidney disease: Secondary | ICD-10-CM | POA: Diagnosis not present

## 2020-10-22 DIAGNOSIS — Z0000 Encounter for general adult medical examination without abnormal findings: Secondary | ICD-10-CM | POA: Diagnosis not present

## 2020-10-22 DIAGNOSIS — F4323 Adjustment disorder with mixed anxiety and depressed mood: Secondary | ICD-10-CM | POA: Diagnosis not present

## 2020-10-22 DIAGNOSIS — N184 Chronic kidney disease, stage 4 (severe): Secondary | ICD-10-CM | POA: Diagnosis not present

## 2020-10-22 DIAGNOSIS — I739 Peripheral vascular disease, unspecified: Secondary | ICD-10-CM | POA: Diagnosis not present

## 2020-10-22 DIAGNOSIS — E10319 Type 1 diabetes mellitus with unspecified diabetic retinopathy without macular edema: Secondary | ICD-10-CM | POA: Diagnosis not present

## 2020-10-22 DIAGNOSIS — Z794 Long term (current) use of insulin: Secondary | ICD-10-CM | POA: Diagnosis not present

## 2020-10-23 DIAGNOSIS — R82998 Other abnormal findings in urine: Secondary | ICD-10-CM | POA: Diagnosis not present

## 2020-10-23 DIAGNOSIS — Z1212 Encounter for screening for malignant neoplasm of rectum: Secondary | ICD-10-CM | POA: Diagnosis not present

## 2020-11-10 DIAGNOSIS — E1040 Type 1 diabetes mellitus with diabetic neuropathy, unspecified: Secondary | ICD-10-CM | POA: Diagnosis not present

## 2020-11-17 DIAGNOSIS — N185 Chronic kidney disease, stage 5: Secondary | ICD-10-CM | POA: Diagnosis not present

## 2020-11-25 DIAGNOSIS — N185 Chronic kidney disease, stage 5: Secondary | ICD-10-CM | POA: Diagnosis not present

## 2020-11-25 DIAGNOSIS — I120 Hypertensive chronic kidney disease with stage 5 chronic kidney disease or end stage renal disease: Secondary | ICD-10-CM | POA: Diagnosis not present

## 2020-11-25 DIAGNOSIS — I770 Arteriovenous fistula, acquired: Secondary | ICD-10-CM | POA: Diagnosis not present

## 2020-11-25 DIAGNOSIS — E1022 Type 1 diabetes mellitus with diabetic chronic kidney disease: Secondary | ICD-10-CM | POA: Diagnosis not present

## 2020-11-25 DIAGNOSIS — E1121 Type 2 diabetes mellitus with diabetic nephropathy: Secondary | ICD-10-CM | POA: Diagnosis not present

## 2020-12-02 DIAGNOSIS — N185 Chronic kidney disease, stage 5: Secondary | ICD-10-CM | POA: Diagnosis not present

## 2020-12-02 DIAGNOSIS — Z4681 Encounter for fitting and adjustment of insulin pump: Secondary | ICD-10-CM | POA: Diagnosis not present

## 2020-12-02 DIAGNOSIS — E1029 Type 1 diabetes mellitus with other diabetic kidney complication: Secondary | ICD-10-CM | POA: Diagnosis not present

## 2020-12-02 DIAGNOSIS — I129 Hypertensive chronic kidney disease with stage 1 through stage 4 chronic kidney disease, or unspecified chronic kidney disease: Secondary | ICD-10-CM | POA: Diagnosis not present

## 2020-12-02 DIAGNOSIS — Z794 Long term (current) use of insulin: Secondary | ICD-10-CM | POA: Diagnosis not present

## 2020-12-29 DIAGNOSIS — H6122 Impacted cerumen, left ear: Secondary | ICD-10-CM | POA: Diagnosis not present

## 2020-12-29 DIAGNOSIS — H401132 Primary open-angle glaucoma, bilateral, moderate stage: Secondary | ICD-10-CM | POA: Diagnosis not present

## 2020-12-29 DIAGNOSIS — H608X2 Other otitis externa, left ear: Secondary | ICD-10-CM | POA: Diagnosis not present

## 2021-01-06 DIAGNOSIS — N185 Chronic kidney disease, stage 5: Secondary | ICD-10-CM | POA: Diagnosis not present

## 2021-01-12 DIAGNOSIS — E1022 Type 1 diabetes mellitus with diabetic chronic kidney disease: Secondary | ICD-10-CM | POA: Diagnosis not present

## 2021-01-12 DIAGNOSIS — N185 Chronic kidney disease, stage 5: Secondary | ICD-10-CM | POA: Diagnosis not present

## 2021-01-12 DIAGNOSIS — E1121 Type 2 diabetes mellitus with diabetic nephropathy: Secondary | ICD-10-CM | POA: Diagnosis not present

## 2021-01-12 DIAGNOSIS — I120 Hypertensive chronic kidney disease with stage 5 chronic kidney disease or end stage renal disease: Secondary | ICD-10-CM | POA: Diagnosis not present

## 2021-01-12 DIAGNOSIS — I770 Arteriovenous fistula, acquired: Secondary | ICD-10-CM | POA: Diagnosis not present

## 2021-01-21 DIAGNOSIS — E1029 Type 1 diabetes mellitus with other diabetic kidney complication: Secondary | ICD-10-CM | POA: Diagnosis not present

## 2021-01-21 DIAGNOSIS — E1040 Type 1 diabetes mellitus with diabetic neuropathy, unspecified: Secondary | ICD-10-CM | POA: Diagnosis not present

## 2021-01-21 DIAGNOSIS — N185 Chronic kidney disease, stage 5: Secondary | ICD-10-CM | POA: Diagnosis not present

## 2021-01-21 DIAGNOSIS — I872 Venous insufficiency (chronic) (peripheral): Secondary | ICD-10-CM | POA: Diagnosis not present

## 2021-01-21 DIAGNOSIS — E10319 Type 1 diabetes mellitus with unspecified diabetic retinopathy without macular edema: Secondary | ICD-10-CM | POA: Diagnosis not present

## 2021-01-21 DIAGNOSIS — I120 Hypertensive chronic kidney disease with stage 5 chronic kidney disease or end stage renal disease: Secondary | ICD-10-CM | POA: Diagnosis not present

## 2021-02-01 HISTORY — PX: COMBINED KIDNEY-PANCREAS TRANSPLANT: SHX1382

## 2021-02-08 DIAGNOSIS — E1022 Type 1 diabetes mellitus with diabetic chronic kidney disease: Secondary | ICD-10-CM | POA: Diagnosis not present

## 2021-02-08 DIAGNOSIS — E108 Type 1 diabetes mellitus with unspecified complications: Secondary | ICD-10-CM | POA: Diagnosis not present

## 2021-02-08 DIAGNOSIS — D62 Acute posthemorrhagic anemia: Secondary | ICD-10-CM | POA: Diagnosis not present

## 2021-02-08 DIAGNOSIS — Z5181 Encounter for therapeutic drug level monitoring: Secondary | ICD-10-CM | POA: Diagnosis not present

## 2021-02-08 DIAGNOSIS — Z01810 Encounter for preprocedural cardiovascular examination: Secondary | ICD-10-CM | POA: Diagnosis not present

## 2021-02-08 DIAGNOSIS — I10 Essential (primary) hypertension: Secondary | ICD-10-CM | POA: Diagnosis not present

## 2021-02-08 DIAGNOSIS — Z4659 Encounter for fitting and adjustment of other gastrointestinal appliance and device: Secondary | ICD-10-CM | POA: Diagnosis not present

## 2021-02-08 DIAGNOSIS — I151 Hypertension secondary to other renal disorders: Secondary | ICD-10-CM | POA: Diagnosis not present

## 2021-02-08 DIAGNOSIS — E1040 Type 1 diabetes mellitus with diabetic neuropathy, unspecified: Secondary | ICD-10-CM | POA: Diagnosis not present

## 2021-02-08 DIAGNOSIS — E10319 Type 1 diabetes mellitus with unspecified diabetic retinopathy without macular edema: Secondary | ICD-10-CM | POA: Diagnosis not present

## 2021-02-08 DIAGNOSIS — Z781 Physical restraint status: Secondary | ICD-10-CM | POA: Diagnosis not present

## 2021-02-08 DIAGNOSIS — R001 Bradycardia, unspecified: Secondary | ICD-10-CM | POA: Diagnosis not present

## 2021-02-08 DIAGNOSIS — K8689 Other specified diseases of pancreas: Secondary | ICD-10-CM | POA: Diagnosis not present

## 2021-02-08 DIAGNOSIS — N184 Chronic kidney disease, stage 4 (severe): Secondary | ICD-10-CM | POA: Diagnosis not present

## 2021-02-08 DIAGNOSIS — Z949 Transplanted organ and tissue status, unspecified: Secondary | ICD-10-CM | POA: Diagnosis not present

## 2021-02-08 DIAGNOSIS — N186 End stage renal disease: Secondary | ICD-10-CM | POA: Diagnosis not present

## 2021-02-08 DIAGNOSIS — Z4822 Encounter for aftercare following kidney transplant: Secondary | ICD-10-CM | POA: Diagnosis not present

## 2021-02-08 DIAGNOSIS — Z20822 Contact with and (suspected) exposure to covid-19: Secondary | ICD-10-CM | POA: Diagnosis not present

## 2021-02-08 DIAGNOSIS — Z940 Kidney transplant status: Secondary | ICD-10-CM | POA: Diagnosis not present

## 2021-02-08 DIAGNOSIS — Z01818 Encounter for other preprocedural examination: Secondary | ICD-10-CM | POA: Diagnosis not present

## 2021-02-08 DIAGNOSIS — E1051 Type 1 diabetes mellitus with diabetic peripheral angiopathy without gangrene: Secondary | ICD-10-CM | POA: Diagnosis not present

## 2021-02-08 DIAGNOSIS — Z9483 Pancreas transplant status: Secondary | ICD-10-CM | POA: Diagnosis not present

## 2021-02-13 DIAGNOSIS — Z940 Kidney transplant status: Secondary | ICD-10-CM | POA: Diagnosis not present

## 2021-02-13 DIAGNOSIS — Z9483 Pancreas transplant status: Secondary | ICD-10-CM | POA: Diagnosis not present

## 2021-02-17 DIAGNOSIS — I10 Essential (primary) hypertension: Secondary | ICD-10-CM | POA: Diagnosis not present

## 2021-02-17 DIAGNOSIS — Z4822 Encounter for aftercare following kidney transplant: Secondary | ICD-10-CM | POA: Diagnosis not present

## 2021-02-17 DIAGNOSIS — E785 Hyperlipidemia, unspecified: Secondary | ICD-10-CM | POA: Diagnosis not present

## 2021-02-17 DIAGNOSIS — D849 Immunodeficiency, unspecified: Secondary | ICD-10-CM | POA: Diagnosis not present

## 2021-02-17 DIAGNOSIS — Z79899 Other long term (current) drug therapy: Secondary | ICD-10-CM | POA: Diagnosis not present

## 2021-02-17 DIAGNOSIS — Z9483 Pancreas transplant status: Secondary | ICD-10-CM | POA: Diagnosis not present

## 2021-02-17 DIAGNOSIS — Z940 Kidney transplant status: Secondary | ICD-10-CM | POA: Diagnosis not present

## 2021-02-17 DIAGNOSIS — R197 Diarrhea, unspecified: Secondary | ICD-10-CM | POA: Diagnosis not present

## 2021-02-17 DIAGNOSIS — D84821 Immunodeficiency due to drugs: Secondary | ICD-10-CM | POA: Diagnosis not present

## 2021-02-17 DIAGNOSIS — Z48288 Encounter for aftercare following multiple organ transplant: Secondary | ICD-10-CM | POA: Diagnosis not present

## 2021-02-17 DIAGNOSIS — D649 Anemia, unspecified: Secondary | ICD-10-CM | POA: Diagnosis not present

## 2021-02-17 DIAGNOSIS — E109 Type 1 diabetes mellitus without complications: Secondary | ICD-10-CM | POA: Diagnosis not present

## 2021-02-18 ENCOUNTER — Encounter (INDEPENDENT_AMBULATORY_CARE_PROVIDER_SITE_OTHER): Payer: Medicare PPO | Admitting: Ophthalmology

## 2021-02-20 DIAGNOSIS — D849 Immunodeficiency, unspecified: Secondary | ICD-10-CM | POA: Diagnosis not present

## 2021-02-20 DIAGNOSIS — E785 Hyperlipidemia, unspecified: Secondary | ICD-10-CM | POA: Diagnosis not present

## 2021-02-20 DIAGNOSIS — Z4822 Encounter for aftercare following kidney transplant: Secondary | ICD-10-CM | POA: Diagnosis not present

## 2021-02-20 DIAGNOSIS — I120 Hypertensive chronic kidney disease with stage 5 chronic kidney disease or end stage renal disease: Secondary | ICD-10-CM | POA: Diagnosis not present

## 2021-02-20 DIAGNOSIS — Z9483 Pancreas transplant status: Secondary | ICD-10-CM | POA: Diagnosis not present

## 2021-02-20 DIAGNOSIS — D649 Anemia, unspecified: Secondary | ICD-10-CM | POA: Diagnosis not present

## 2021-02-20 DIAGNOSIS — Z79899 Other long term (current) drug therapy: Secondary | ICD-10-CM | POA: Diagnosis not present

## 2021-02-20 DIAGNOSIS — Z48288 Encounter for aftercare following multiple organ transplant: Secondary | ICD-10-CM | POA: Diagnosis not present

## 2021-02-20 DIAGNOSIS — Z940 Kidney transplant status: Secondary | ICD-10-CM | POA: Diagnosis not present

## 2021-02-20 DIAGNOSIS — R197 Diarrhea, unspecified: Secondary | ICD-10-CM | POA: Diagnosis not present

## 2021-02-20 DIAGNOSIS — T8149XA Infection following a procedure, other surgical site, initial encounter: Secondary | ICD-10-CM | POA: Diagnosis not present

## 2021-02-20 DIAGNOSIS — Z5181 Encounter for therapeutic drug level monitoring: Secondary | ICD-10-CM | POA: Diagnosis not present

## 2021-02-23 DIAGNOSIS — T8149XA Infection following a procedure, other surgical site, initial encounter: Secondary | ICD-10-CM | POA: Diagnosis not present

## 2021-02-23 DIAGNOSIS — D84821 Immunodeficiency due to drugs: Secondary | ICD-10-CM | POA: Diagnosis not present

## 2021-02-23 DIAGNOSIS — D8989 Other specified disorders involving the immune mechanism, not elsewhere classified: Secondary | ICD-10-CM | POA: Diagnosis not present

## 2021-02-23 DIAGNOSIS — E109 Type 1 diabetes mellitus without complications: Secondary | ICD-10-CM | POA: Diagnosis not present

## 2021-02-23 DIAGNOSIS — E875 Hyperkalemia: Secondary | ICD-10-CM | POA: Diagnosis not present

## 2021-02-23 DIAGNOSIS — R197 Diarrhea, unspecified: Secondary | ICD-10-CM | POA: Diagnosis not present

## 2021-02-23 DIAGNOSIS — I120 Hypertensive chronic kidney disease with stage 5 chronic kidney disease or end stage renal disease: Secondary | ICD-10-CM | POA: Diagnosis not present

## 2021-02-23 DIAGNOSIS — E1022 Type 1 diabetes mellitus with diabetic chronic kidney disease: Secondary | ICD-10-CM | POA: Diagnosis not present

## 2021-02-23 DIAGNOSIS — Z4822 Encounter for aftercare following kidney transplant: Secondary | ICD-10-CM | POA: Diagnosis not present

## 2021-02-23 DIAGNOSIS — D649 Anemia, unspecified: Secondary | ICD-10-CM | POA: Diagnosis not present

## 2021-02-23 DIAGNOSIS — E785 Hyperlipidemia, unspecified: Secondary | ICD-10-CM | POA: Diagnosis not present

## 2021-02-23 DIAGNOSIS — N186 End stage renal disease: Secondary | ICD-10-CM | POA: Diagnosis not present

## 2021-02-23 DIAGNOSIS — Z48288 Encounter for aftercare following multiple organ transplant: Secondary | ICD-10-CM | POA: Diagnosis not present

## 2021-02-23 DIAGNOSIS — Z940 Kidney transplant status: Secondary | ICD-10-CM | POA: Diagnosis not present

## 2021-02-23 DIAGNOSIS — Z79899 Other long term (current) drug therapy: Secondary | ICD-10-CM | POA: Diagnosis not present

## 2021-02-23 DIAGNOSIS — Z9483 Pancreas transplant status: Secondary | ICD-10-CM | POA: Diagnosis not present

## 2021-02-26 DIAGNOSIS — N185 Chronic kidney disease, stage 5: Secondary | ICD-10-CM | POA: Diagnosis not present

## 2021-02-26 DIAGNOSIS — Z9483 Pancreas transplant status: Secondary | ICD-10-CM | POA: Diagnosis not present

## 2021-02-26 DIAGNOSIS — Z940 Kidney transplant status: Secondary | ICD-10-CM | POA: Diagnosis not present

## 2021-02-26 DIAGNOSIS — I129 Hypertensive chronic kidney disease with stage 1 through stage 4 chronic kidney disease, or unspecified chronic kidney disease: Secondary | ICD-10-CM | POA: Diagnosis not present

## 2021-02-26 DIAGNOSIS — E1029 Type 1 diabetes mellitus with other diabetic kidney complication: Secondary | ICD-10-CM | POA: Diagnosis not present

## 2021-02-27 DIAGNOSIS — D649 Anemia, unspecified: Secondary | ICD-10-CM | POA: Diagnosis not present

## 2021-02-27 DIAGNOSIS — Z48288 Encounter for aftercare following multiple organ transplant: Secondary | ICD-10-CM | POA: Diagnosis not present

## 2021-02-27 DIAGNOSIS — E109 Type 1 diabetes mellitus without complications: Secondary | ICD-10-CM | POA: Diagnosis not present

## 2021-02-27 DIAGNOSIS — E785 Hyperlipidemia, unspecified: Secondary | ICD-10-CM | POA: Diagnosis not present

## 2021-02-27 DIAGNOSIS — G629 Polyneuropathy, unspecified: Secondary | ICD-10-CM | POA: Diagnosis not present

## 2021-02-27 DIAGNOSIS — Z9483 Pancreas transplant status: Secondary | ICD-10-CM | POA: Diagnosis not present

## 2021-02-27 DIAGNOSIS — E875 Hyperkalemia: Secondary | ICD-10-CM | POA: Diagnosis not present

## 2021-02-27 DIAGNOSIS — T8141XA Infection following a procedure, superficial incisional surgical site, initial encounter: Secondary | ICD-10-CM | POA: Diagnosis not present

## 2021-03-03 DIAGNOSIS — I151 Hypertension secondary to other renal disorders: Secondary | ICD-10-CM | POA: Diagnosis not present

## 2021-03-03 DIAGNOSIS — D849 Immunodeficiency, unspecified: Secondary | ICD-10-CM | POA: Diagnosis not present

## 2021-03-03 DIAGNOSIS — Z9483 Pancreas transplant status: Secondary | ICD-10-CM | POA: Diagnosis not present

## 2021-03-03 DIAGNOSIS — K5900 Constipation, unspecified: Secondary | ICD-10-CM | POA: Diagnosis not present

## 2021-03-03 DIAGNOSIS — N2889 Other specified disorders of kidney and ureter: Secondary | ICD-10-CM | POA: Diagnosis not present

## 2021-03-03 DIAGNOSIS — D649 Anemia, unspecified: Secondary | ICD-10-CM | POA: Diagnosis not present

## 2021-03-03 DIAGNOSIS — Z48288 Encounter for aftercare following multiple organ transplant: Secondary | ICD-10-CM | POA: Diagnosis not present

## 2021-03-03 DIAGNOSIS — Z940 Kidney transplant status: Secondary | ICD-10-CM | POA: Diagnosis not present

## 2021-03-03 DIAGNOSIS — E785 Hyperlipidemia, unspecified: Secondary | ICD-10-CM | POA: Diagnosis not present

## 2021-03-04 DIAGNOSIS — Z940 Kidney transplant status: Secondary | ICD-10-CM | POA: Diagnosis not present

## 2021-03-04 DIAGNOSIS — I129 Hypertensive chronic kidney disease with stage 1 through stage 4 chronic kidney disease, or unspecified chronic kidney disease: Secondary | ICD-10-CM | POA: Diagnosis not present

## 2021-03-04 DIAGNOSIS — E1029 Type 1 diabetes mellitus with other diabetic kidney complication: Secondary | ICD-10-CM | POA: Diagnosis not present

## 2021-03-04 DIAGNOSIS — Z9483 Pancreas transplant status: Secondary | ICD-10-CM | POA: Diagnosis not present

## 2021-03-05 ENCOUNTER — Encounter (INDEPENDENT_AMBULATORY_CARE_PROVIDER_SITE_OTHER): Payer: Medicare PPO | Admitting: Ophthalmology

## 2021-03-06 DIAGNOSIS — Z949 Transplanted organ and tissue status, unspecified: Secondary | ICD-10-CM | POA: Diagnosis not present

## 2021-03-06 DIAGNOSIS — Z940 Kidney transplant status: Secondary | ICD-10-CM | POA: Diagnosis not present

## 2021-03-06 DIAGNOSIS — Z79899 Other long term (current) drug therapy: Secondary | ICD-10-CM | POA: Diagnosis not present

## 2021-03-06 DIAGNOSIS — Z5181 Encounter for therapeutic drug level monitoring: Secondary | ICD-10-CM | POA: Diagnosis not present

## 2021-03-06 DIAGNOSIS — Z4822 Encounter for aftercare following kidney transplant: Secondary | ICD-10-CM | POA: Diagnosis not present

## 2021-03-06 DIAGNOSIS — I951 Orthostatic hypotension: Secondary | ICD-10-CM | POA: Diagnosis not present

## 2021-03-06 DIAGNOSIS — D84821 Immunodeficiency due to drugs: Secondary | ICD-10-CM | POA: Diagnosis not present

## 2021-03-06 DIAGNOSIS — Z48288 Encounter for aftercare following multiple organ transplant: Secondary | ICD-10-CM | POA: Diagnosis not present

## 2021-03-06 DIAGNOSIS — Z9483 Pancreas transplant status: Secondary | ICD-10-CM | POA: Diagnosis not present

## 2021-03-06 DIAGNOSIS — K8689 Other specified diseases of pancreas: Secondary | ICD-10-CM | POA: Diagnosis not present

## 2021-03-09 DIAGNOSIS — R82998 Other abnormal findings in urine: Secondary | ICD-10-CM | POA: Diagnosis not present

## 2021-03-09 DIAGNOSIS — R319 Hematuria, unspecified: Secondary | ICD-10-CM | POA: Diagnosis not present

## 2021-03-09 DIAGNOSIS — Z960 Presence of urogenital implants: Secondary | ICD-10-CM | POA: Diagnosis not present

## 2021-03-10 DIAGNOSIS — Z5181 Encounter for therapeutic drug level monitoring: Secondary | ICD-10-CM | POA: Diagnosis not present

## 2021-03-10 DIAGNOSIS — D849 Immunodeficiency, unspecified: Secondary | ICD-10-CM | POA: Diagnosis not present

## 2021-03-10 DIAGNOSIS — M79644 Pain in right finger(s): Secondary | ICD-10-CM | POA: Diagnosis not present

## 2021-03-10 DIAGNOSIS — N186 End stage renal disease: Secondary | ICD-10-CM | POA: Diagnosis not present

## 2021-03-10 DIAGNOSIS — I951 Orthostatic hypotension: Secondary | ICD-10-CM | POA: Diagnosis not present

## 2021-03-10 DIAGNOSIS — I120 Hypertensive chronic kidney disease with stage 5 chronic kidney disease or end stage renal disease: Secondary | ICD-10-CM | POA: Diagnosis not present

## 2021-03-10 DIAGNOSIS — Z940 Kidney transplant status: Secondary | ICD-10-CM | POA: Diagnosis not present

## 2021-03-10 DIAGNOSIS — Z79899 Other long term (current) drug therapy: Secondary | ICD-10-CM | POA: Diagnosis not present

## 2021-03-10 DIAGNOSIS — Z9483 Pancreas transplant status: Secondary | ICD-10-CM | POA: Diagnosis not present

## 2021-03-10 DIAGNOSIS — T86858 Other complications of intestine transplant: Secondary | ICD-10-CM | POA: Diagnosis not present

## 2021-03-10 DIAGNOSIS — Z48298 Encounter for aftercare following other organ transplant: Secondary | ICD-10-CM | POA: Diagnosis not present

## 2021-03-10 DIAGNOSIS — D631 Anemia in chronic kidney disease: Secondary | ICD-10-CM | POA: Diagnosis not present

## 2021-03-10 DIAGNOSIS — D84821 Immunodeficiency due to drugs: Secondary | ICD-10-CM | POA: Diagnosis not present

## 2021-03-10 DIAGNOSIS — E1122 Type 2 diabetes mellitus with diabetic chronic kidney disease: Secondary | ICD-10-CM | POA: Diagnosis not present

## 2021-03-10 DIAGNOSIS — Z4822 Encounter for aftercare following kidney transplant: Secondary | ICD-10-CM | POA: Diagnosis not present

## 2021-03-10 DIAGNOSIS — Z48288 Encounter for aftercare following multiple organ transplant: Secondary | ICD-10-CM | POA: Diagnosis not present

## 2021-03-10 DIAGNOSIS — R200 Anesthesia of skin: Secondary | ICD-10-CM | POA: Diagnosis not present

## 2021-03-10 DIAGNOSIS — D649 Anemia, unspecified: Secondary | ICD-10-CM | POA: Diagnosis not present

## 2021-03-13 DIAGNOSIS — T8149XA Infection following a procedure, other surgical site, initial encounter: Secondary | ICD-10-CM | POA: Diagnosis not present

## 2021-03-13 DIAGNOSIS — D84821 Immunodeficiency due to drugs: Secondary | ICD-10-CM | POA: Diagnosis not present

## 2021-03-13 DIAGNOSIS — Z9483 Pancreas transplant status: Secondary | ICD-10-CM | POA: Diagnosis not present

## 2021-03-13 DIAGNOSIS — D649 Anemia, unspecified: Secondary | ICD-10-CM | POA: Diagnosis not present

## 2021-03-13 DIAGNOSIS — I770 Arteriovenous fistula, acquired: Secondary | ICD-10-CM | POA: Diagnosis not present

## 2021-03-13 DIAGNOSIS — Z4822 Encounter for aftercare following kidney transplant: Secondary | ICD-10-CM | POA: Diagnosis not present

## 2021-03-13 DIAGNOSIS — D849 Immunodeficiency, unspecified: Secondary | ICD-10-CM | POA: Diagnosis not present

## 2021-03-13 DIAGNOSIS — I951 Orthostatic hypotension: Secondary | ICD-10-CM | POA: Diagnosis not present

## 2021-03-13 DIAGNOSIS — N186 End stage renal disease: Secondary | ICD-10-CM | POA: Diagnosis not present

## 2021-03-13 DIAGNOSIS — M79644 Pain in right finger(s): Secondary | ICD-10-CM | POA: Diagnosis not present

## 2021-03-13 DIAGNOSIS — E1042 Type 1 diabetes mellitus with diabetic polyneuropathy: Secondary | ICD-10-CM | POA: Diagnosis not present

## 2021-03-13 DIAGNOSIS — Z940 Kidney transplant status: Secondary | ICD-10-CM | POA: Diagnosis not present

## 2021-03-16 DIAGNOSIS — Z48288 Encounter for aftercare following multiple organ transplant: Secondary | ICD-10-CM | POA: Diagnosis not present

## 2021-03-16 DIAGNOSIS — D84821 Immunodeficiency due to drugs: Secondary | ICD-10-CM | POA: Diagnosis not present

## 2021-03-16 DIAGNOSIS — N186 End stage renal disease: Secondary | ICD-10-CM | POA: Diagnosis not present

## 2021-03-16 DIAGNOSIS — S31109D Unspecified open wound of abdominal wall, unspecified quadrant without penetration into peritoneal cavity, subsequent encounter: Secondary | ICD-10-CM | POA: Diagnosis not present

## 2021-03-16 DIAGNOSIS — Z4822 Encounter for aftercare following kidney transplant: Secondary | ICD-10-CM | POA: Diagnosis not present

## 2021-03-16 DIAGNOSIS — I951 Orthostatic hypotension: Secondary | ICD-10-CM | POA: Diagnosis not present

## 2021-03-16 DIAGNOSIS — D649 Anemia, unspecified: Secondary | ICD-10-CM | POA: Diagnosis not present

## 2021-03-16 DIAGNOSIS — D849 Immunodeficiency, unspecified: Secondary | ICD-10-CM | POA: Diagnosis not present

## 2021-03-16 DIAGNOSIS — I120 Hypertensive chronic kidney disease with stage 5 chronic kidney disease or end stage renal disease: Secondary | ICD-10-CM | POA: Diagnosis not present

## 2021-03-16 DIAGNOSIS — Z9483 Pancreas transplant status: Secondary | ICD-10-CM | POA: Diagnosis not present

## 2021-03-16 DIAGNOSIS — E1022 Type 1 diabetes mellitus with diabetic chronic kidney disease: Secondary | ICD-10-CM | POA: Diagnosis not present

## 2021-03-16 DIAGNOSIS — E785 Hyperlipidemia, unspecified: Secondary | ICD-10-CM | POA: Diagnosis not present

## 2021-03-16 DIAGNOSIS — G629 Polyneuropathy, unspecified: Secondary | ICD-10-CM | POA: Diagnosis not present

## 2021-03-16 DIAGNOSIS — Z5181 Encounter for therapeutic drug level monitoring: Secondary | ICD-10-CM | POA: Diagnosis not present

## 2021-03-16 DIAGNOSIS — Z79899 Other long term (current) drug therapy: Secondary | ICD-10-CM | POA: Diagnosis not present

## 2021-03-16 DIAGNOSIS — Z940 Kidney transplant status: Secondary | ICD-10-CM | POA: Diagnosis not present

## 2021-03-24 DIAGNOSIS — Z9483 Pancreas transplant status: Secondary | ICD-10-CM | POA: Diagnosis not present

## 2021-03-24 DIAGNOSIS — T8149XA Infection following a procedure, other surgical site, initial encounter: Secondary | ICD-10-CM | POA: Diagnosis not present

## 2021-03-24 DIAGNOSIS — E785 Hyperlipidemia, unspecified: Secondary | ICD-10-CM | POA: Diagnosis not present

## 2021-03-24 DIAGNOSIS — Z940 Kidney transplant status: Secondary | ICD-10-CM | POA: Diagnosis not present

## 2021-03-24 DIAGNOSIS — I10 Essential (primary) hypertension: Secondary | ICD-10-CM | POA: Diagnosis not present

## 2021-03-24 DIAGNOSIS — E1042 Type 1 diabetes mellitus with diabetic polyneuropathy: Secondary | ICD-10-CM | POA: Diagnosis not present

## 2021-03-24 DIAGNOSIS — Z4822 Encounter for aftercare following kidney transplant: Secondary | ICD-10-CM | POA: Diagnosis not present

## 2021-03-24 DIAGNOSIS — D849 Immunodeficiency, unspecified: Secondary | ICD-10-CM | POA: Diagnosis not present

## 2021-03-24 DIAGNOSIS — D84821 Immunodeficiency due to drugs: Secondary | ICD-10-CM | POA: Diagnosis not present

## 2021-03-24 DIAGNOSIS — D649 Anemia, unspecified: Secondary | ICD-10-CM | POA: Diagnosis not present

## 2021-03-25 DIAGNOSIS — Z886 Allergy status to analgesic agent status: Secondary | ICD-10-CM | POA: Diagnosis not present

## 2021-03-25 DIAGNOSIS — R202 Paresthesia of skin: Secondary | ICD-10-CM | POA: Diagnosis not present

## 2021-03-25 DIAGNOSIS — Z881 Allergy status to other antibiotic agents status: Secondary | ICD-10-CM | POA: Diagnosis not present

## 2021-03-25 DIAGNOSIS — R200 Anesthesia of skin: Secondary | ICD-10-CM | POA: Diagnosis not present

## 2021-03-25 DIAGNOSIS — Z888 Allergy status to other drugs, medicaments and biological substances status: Secondary | ICD-10-CM | POA: Diagnosis not present

## 2021-03-25 DIAGNOSIS — G5603 Carpal tunnel syndrome, bilateral upper limbs: Secondary | ICD-10-CM | POA: Diagnosis not present

## 2021-03-31 DIAGNOSIS — Z940 Kidney transplant status: Secondary | ICD-10-CM | POA: Diagnosis not present

## 2021-03-31 DIAGNOSIS — I951 Orthostatic hypotension: Secondary | ICD-10-CM | POA: Diagnosis not present

## 2021-03-31 DIAGNOSIS — Z881 Allergy status to other antibiotic agents status: Secondary | ICD-10-CM | POA: Diagnosis not present

## 2021-03-31 DIAGNOSIS — D649 Anemia, unspecified: Secondary | ICD-10-CM | POA: Diagnosis not present

## 2021-03-31 DIAGNOSIS — G5603 Carpal tunnel syndrome, bilateral upper limbs: Secondary | ICD-10-CM | POA: Diagnosis not present

## 2021-03-31 DIAGNOSIS — E785 Hyperlipidemia, unspecified: Secondary | ICD-10-CM | POA: Diagnosis not present

## 2021-03-31 DIAGNOSIS — Z886 Allergy status to analgesic agent status: Secondary | ICD-10-CM | POA: Diagnosis not present

## 2021-03-31 DIAGNOSIS — T8149XA Infection following a procedure, other surgical site, initial encounter: Secondary | ICD-10-CM | POA: Diagnosis not present

## 2021-03-31 DIAGNOSIS — G5690 Unspecified mononeuropathy of unspecified upper limb: Secondary | ICD-10-CM | POA: Diagnosis not present

## 2021-03-31 DIAGNOSIS — D849 Immunodeficiency, unspecified: Secondary | ICD-10-CM | POA: Diagnosis not present

## 2021-03-31 DIAGNOSIS — Z9483 Pancreas transplant status: Secondary | ICD-10-CM | POA: Diagnosis not present

## 2021-03-31 DIAGNOSIS — Z79899 Other long term (current) drug therapy: Secondary | ICD-10-CM | POA: Diagnosis not present

## 2021-03-31 DIAGNOSIS — Z48288 Encounter for aftercare following multiple organ transplant: Secondary | ICD-10-CM | POA: Diagnosis not present

## 2021-04-08 DIAGNOSIS — D849 Immunodeficiency, unspecified: Secondary | ICD-10-CM | POA: Diagnosis not present

## 2021-04-08 DIAGNOSIS — D649 Anemia, unspecified: Secondary | ICD-10-CM | POA: Diagnosis not present

## 2021-04-08 DIAGNOSIS — Z940 Kidney transplant status: Secondary | ICD-10-CM | POA: Diagnosis not present

## 2021-04-08 DIAGNOSIS — N261 Atrophy of kidney (terminal): Secondary | ICD-10-CM | POA: Diagnosis not present

## 2021-04-08 DIAGNOSIS — Z4822 Encounter for aftercare following kidney transplant: Secondary | ICD-10-CM | POA: Diagnosis not present

## 2021-04-08 DIAGNOSIS — Z949 Transplanted organ and tissue status, unspecified: Secondary | ICD-10-CM | POA: Diagnosis not present

## 2021-04-08 DIAGNOSIS — I951 Orthostatic hypotension: Secondary | ICD-10-CM | POA: Diagnosis not present

## 2021-04-08 DIAGNOSIS — T8149XA Infection following a procedure, other surgical site, initial encounter: Secondary | ICD-10-CM | POA: Diagnosis not present

## 2021-04-08 DIAGNOSIS — E1022 Type 1 diabetes mellitus with diabetic chronic kidney disease: Secondary | ICD-10-CM | POA: Diagnosis not present

## 2021-04-08 DIAGNOSIS — E785 Hyperlipidemia, unspecified: Secondary | ICD-10-CM | POA: Diagnosis not present

## 2021-04-08 DIAGNOSIS — Z48288 Encounter for aftercare following multiple organ transplant: Secondary | ICD-10-CM | POA: Diagnosis not present

## 2021-04-08 DIAGNOSIS — G5602 Carpal tunnel syndrome, left upper limb: Secondary | ICD-10-CM | POA: Diagnosis not present

## 2021-04-08 DIAGNOSIS — K828 Other specified diseases of gallbladder: Secondary | ICD-10-CM | POA: Diagnosis not present

## 2021-04-08 DIAGNOSIS — Z5181 Encounter for therapeutic drug level monitoring: Secondary | ICD-10-CM | POA: Diagnosis not present

## 2021-04-08 DIAGNOSIS — Z79899 Other long term (current) drug therapy: Secondary | ICD-10-CM | POA: Diagnosis not present

## 2021-04-22 DIAGNOSIS — Z9889 Other specified postprocedural states: Secondary | ICD-10-CM | POA: Diagnosis not present

## 2021-04-22 DIAGNOSIS — Z48288 Encounter for aftercare following multiple organ transplant: Secondary | ICD-10-CM | POA: Diagnosis not present

## 2021-04-22 DIAGNOSIS — Z940 Kidney transplant status: Secondary | ICD-10-CM | POA: Diagnosis not present

## 2021-04-22 DIAGNOSIS — Z4822 Encounter for aftercare following kidney transplant: Secondary | ICD-10-CM | POA: Diagnosis not present

## 2021-04-22 DIAGNOSIS — Z9483 Pancreas transplant status: Secondary | ICD-10-CM | POA: Diagnosis not present

## 2021-04-22 DIAGNOSIS — Z949 Transplanted organ and tissue status, unspecified: Secondary | ICD-10-CM | POA: Diagnosis not present

## 2021-04-22 DIAGNOSIS — Z7952 Long term (current) use of systemic steroids: Secondary | ICD-10-CM | POA: Diagnosis not present

## 2021-04-22 DIAGNOSIS — Z79899 Other long term (current) drug therapy: Secondary | ICD-10-CM | POA: Diagnosis not present

## 2021-04-22 DIAGNOSIS — Z862 Personal history of diseases of the blood and blood-forming organs and certain disorders involving the immune mechanism: Secondary | ICD-10-CM | POA: Diagnosis not present

## 2021-04-22 DIAGNOSIS — Z5181 Encounter for therapeutic drug level monitoring: Secondary | ICD-10-CM | POA: Diagnosis not present

## 2021-04-22 DIAGNOSIS — E1029 Type 1 diabetes mellitus with other diabetic kidney complication: Secondary | ICD-10-CM | POA: Diagnosis not present

## 2021-04-22 DIAGNOSIS — G5603 Carpal tunnel syndrome, bilateral upper limbs: Secondary | ICD-10-CM | POA: Diagnosis not present

## 2021-04-22 DIAGNOSIS — D849 Immunodeficiency, unspecified: Secondary | ICD-10-CM | POA: Diagnosis not present

## 2021-04-22 DIAGNOSIS — I120 Hypertensive chronic kidney disease with stage 5 chronic kidney disease or end stage renal disease: Secondary | ICD-10-CM | POA: Diagnosis not present

## 2021-04-22 DIAGNOSIS — I951 Orthostatic hypotension: Secondary | ICD-10-CM | POA: Diagnosis not present

## 2021-04-22 DIAGNOSIS — E1042 Type 1 diabetes mellitus with diabetic polyneuropathy: Secondary | ICD-10-CM | POA: Diagnosis not present

## 2021-05-04 ENCOUNTER — Telehealth: Payer: Self-pay

## 2021-05-04 DIAGNOSIS — N401 Enlarged prostate with lower urinary tract symptoms: Secondary | ICD-10-CM | POA: Diagnosis not present

## 2021-05-04 DIAGNOSIS — R338 Other retention of urine: Secondary | ICD-10-CM | POA: Diagnosis not present

## 2021-05-04 NOTE — Telephone Encounter (Signed)
Patient called wanting to know the last date of service with our office.  He is filling out disability paperwork and needs information.  Patient also had a question about getting a medical alert bracelet of some type.  I advised patient that even though he had a Kidney transplant he should still be careful with the AVF just in case he every has to use it.  If he wants a medical bracelet to help him remember that would be up to him.  Patient verbalized understanding.

## 2021-05-06 DIAGNOSIS — T8149XA Infection following a procedure, other surgical site, initial encounter: Secondary | ICD-10-CM | POA: Diagnosis not present

## 2021-05-06 DIAGNOSIS — E109 Type 1 diabetes mellitus without complications: Secondary | ICD-10-CM | POA: Diagnosis not present

## 2021-05-06 DIAGNOSIS — Z48288 Encounter for aftercare following multiple organ transplant: Secondary | ICD-10-CM | POA: Diagnosis not present

## 2021-05-06 DIAGNOSIS — Z949 Transplanted organ and tissue status, unspecified: Secondary | ICD-10-CM | POA: Diagnosis not present

## 2021-05-06 DIAGNOSIS — Z940 Kidney transplant status: Secondary | ICD-10-CM | POA: Diagnosis not present

## 2021-05-06 DIAGNOSIS — I951 Orthostatic hypotension: Secondary | ICD-10-CM | POA: Diagnosis not present

## 2021-05-06 DIAGNOSIS — G5603 Carpal tunnel syndrome, bilateral upper limbs: Secondary | ICD-10-CM | POA: Diagnosis not present

## 2021-05-06 DIAGNOSIS — D8989 Other specified disorders involving the immune mechanism, not elsewhere classified: Secondary | ICD-10-CM | POA: Diagnosis not present

## 2021-05-06 DIAGNOSIS — Z9483 Pancreas transplant status: Secondary | ICD-10-CM | POA: Diagnosis not present

## 2021-05-06 DIAGNOSIS — R339 Retention of urine, unspecified: Secondary | ICD-10-CM | POA: Diagnosis not present

## 2021-05-06 DIAGNOSIS — D649 Anemia, unspecified: Secondary | ICD-10-CM | POA: Diagnosis not present

## 2021-05-06 DIAGNOSIS — E785 Hyperlipidemia, unspecified: Secondary | ICD-10-CM | POA: Diagnosis not present

## 2021-05-20 ENCOUNTER — Ambulatory Visit (INDEPENDENT_AMBULATORY_CARE_PROVIDER_SITE_OTHER): Payer: Medicare PPO | Admitting: Ophthalmology

## 2021-05-20 ENCOUNTER — Other Ambulatory Visit: Payer: Self-pay

## 2021-05-20 ENCOUNTER — Encounter (INDEPENDENT_AMBULATORY_CARE_PROVIDER_SITE_OTHER): Payer: Self-pay | Admitting: Ophthalmology

## 2021-05-20 DIAGNOSIS — H35372 Puckering of macula, left eye: Secondary | ICD-10-CM | POA: Diagnosis not present

## 2021-05-20 DIAGNOSIS — E103553 Type 1 diabetes mellitus with stable proliferative diabetic retinopathy, bilateral: Secondary | ICD-10-CM

## 2021-05-20 NOTE — Assessment & Plan Note (Signed)
Excellent visual acuity none distorting

## 2021-05-20 NOTE — Progress Notes (Signed)
05/20/2021     CHIEF COMPLAINT Patient presents for Retina Follow Up   HISTORY OF PRESENT ILLNESS: Dillon Lane is a 60 y.o. male who presents to the clinic today for:   HPI     Retina Follow Up           Diagnosis: Diabetic Retinopathy   Laterality: both eyes   Onset: 1 year ago   Severity: mild   Duration: 1 year   Course: stable         Comments   1 yr fu ou fp  Pt. States vision is stable, pt floaters stable and denies new FOL. Pt. is using timolol qam ou and bimatoprost (lumigan) ou qhs. A1C: 5.1 BS: pt states " I have not checked it in long time, have not been on insulin since May."      Last edited by Laurin Coder, COA on 05/20/2021  9:09 AM.      Referring physician: Burnard Bunting, MD [place],  [place] [address]  HISTORICAL INFORMATION:   Selected notes from the [facility]: Current Outpatient Medications (Ophthalmic Drugs)  Medication Sig   bimatoprost (LUMIGAN) 0.01 % SOLN Place 1 drop into both eyes at bedtime.   Carboxymethylcellul-Glycerin (LUBRICATING EYE DROPS OP) Place 1 drop into the right eye daily as needed (dry eyes).   timolol (TIMOPTIC) 0.5 % ophthalmic solution Place 1 drop into both eyes daily.    Timolol Maleate PF 0.5 % SOLN Place 1 drop into both eyes daily.   No current facility-administered medications for this visit. (Ophthalmic Drugs)   Current Outpatient Medications (Other)  Medication Sig   AMLODIPINE BENZOATE PO Take 5 mg by mouth every evening.   aspirin 81 MG tablet Take 81 mg by mouth daily.   cetirizine (ZYRTEC) 10 MG tablet Take 10 mg by mouth daily as needed for allergies.    Cholecalciferol (VITAMIN D) 50 MCG (2000 UT) tablet Take 2,000 Units by mouth daily.   Colloidal Oatmeal (GOLD BOND ECZEMA RELIEF EX) Apply 1 application topically daily as needed (eczema).   Emollient (UDDERLY SMOOTH) CREA Apply 1 application topically daily as needed (itching).    furosemide (LASIX) 40 MG tablet Take 40 mg by mouth daily.   HUMALOG 100 UNIT/ML injection Inject 45-50 Units into the skin daily. Uses in insulin pump averages about 45-50 units per day   hydrALAZINE (APRESOLINE) 50 MG tablet Take 50 mg by mouth 3 (three) times daily.    HYDROcodone-acetaminophen (NORCO) 5-325 MG tablet Take 1 tablet by mouth every 6 (six) hours as needed. (Patient not taking: Reported on 02/25/2020)   Lactase (LACTAID PO) Take 2 tablets by mouth daily as needed (consuming milk).    rosuvastatin (CRESTOR) 20 MG tablet Take 20 mg by mouth every Monday.    No current facility-administered medications for this visit. (Other)      REVIEW OF SYSTEMS:    ALLERGIES Allergies  Allergen Reactions   Tetracycline Shortness Of Breath    "Swelling"   Tetracyclines & Related Shortness Of Breath and Swelling   Milk-Related Compounds Diarrhea    Bloating, cramping   Nsaids     Due to poor kidney function    PAST MEDICAL HISTORY Past Medical History:  Diagnosis Date   Allergy    fall   Cancer [facility])    pt states he has never been diagnosed    Chronic kidney disease    stage  4   Depression    05-2016- no meds    Diabetes mellitus without complication (HCC)    DM type 1, on insulin pump.   Glaucoma    Glaucoma    Hyperlipidemia    Hypertension    OSA on CPAP    Osteoporosis    PONV (postoperative nausea and vomiting)    1 time got nauseated, then passed out (one of the eye surgeries)   Retinopathy    20/200 in right eye   Sleep apnea    wears cpap   Syncope and collapse    states he can cough and pass out   Past Surgical History:  Procedure Laterality Date   AV FISTULA PLACEMENT Right 11/08/2019   Procedure: Creation of RIGHT ARM Radiocephalic ARTERIOVENOUS (AV) FISTULA;  Surgeon: Waynetta Sandy, MD;  Location: [place];  Service: Vascular;  Laterality: Right;   CATARACT EXTRACTION  1994   left eye   COLONOSCOPY     DG BARIUM SWALLOW ([place] HX)  1994    EYE SURGERY     x3 with 9 sessions of lasers for retinopathy.   glaucoma surgery     Dr Maylene Roes   HAND SURGERY  2 years ago   x2   POLYPECTOMY     VITRECTOMY     x3    FAMILY HISTORY Family History  Problem Relation Age of Onset   Brain cancer Mother    Prostate cancer Father    Colon cancer Neg Hx    Colon polyps Neg Hx    Rectal cancer Neg Hx    Stomach cancer Neg Hx    Esophageal cancer Neg Hx     SOCIAL HISTORY Social History   Tobacco Use   Smoking status: Never   Smokeless tobacco: Never  Vaping Use   Vaping Use: Never used  Substance Use Topics   Alcohol use: No   Drug use: No         OPHTHALMIC EXAM:  Base Eye Exam     Visual Acuity (Snellen - Linear)       Right Left   Dist cc 20/200 20/25 -1   Dist ph cc 20/80 -2          Tonometry (Tonopen, 9:20 AM)       Right Left   Pressure 17 15         Pupils       Dark Light Shape React APD   Right 2  Irregular  None   Left 2 2 Round Minimal None         Visual Fields (Counting fingers)       Left Right   Restrictions Partial outer superior temporal, inferior temporal, superior nasal, inferior nasal deficiencies Partial outer superior temporal, inferior temporal, superior nasal, inferior nasal deficiencies         Extraocular Movement       Right Left    Full Full         Neuro/Psych     Oriented x3: Yes   Mood/Affect: Normal         Dilation     Both eyes: 1.0% Mydriacyl, 2.5% Phenylephrine @ 9:20 AM           Slit Lamp and Fundus Exam     External Exam       Right Left   External Normal Normal         Slit Lamp Exam       Right  Left   Lids/Lashes Normal Normal   Conjunctiva/Sclera White and quiet White and quiet   Cornea Clear Clear   Anterior Chamber Deep and quiet Deep and quiet   Iris Irregular pupil Round and reactive   Lens Aphakia Posterior chamber intraocular lens   Anterior Vitreous Normal Normal         Fundus Exam       Right Left    Posterior Vitreous Vitrectomized Vitrectomized   Disc Pallor 1+ Pallor 1+   C/D Ratio 0.55 0.55   Macula Geographic atrophy small central region in the fovea, residual from prior resolved CSME decades previous Geographic atrophy small central region in the fovea, residual from prior resolved CSME decades previous   Vessels PDR-quiet PDR-quiet   Periphery Good PRP peripherally, with a "lesion creep" expansion of laser chorioretinal scars Good PRP peripherally, with a "lesion creep" expansion of laser chorioretinal scars            IMAGING AND PROCEDURES  Imaging and Procedures for 05/20/21  Color Fundus Photography Optos - OU - Both Eyes       Right Eye Progression has been stable. Disc findings include increased cup to disc ratio. Macula : normal observations.   Left Eye Progression has been stable. Disc findings include increased cup to disc ratio. Macula : normal observations.   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  Some lesion creep previous PRP as all PRP had been initially delivered outside of the temporal arcades.     ICD-10-CM   1. Controlled type 1 diabetes mellitus with stable proliferative retinopathy of both eyes (HCC)  U23.5361 Color Fundus Photography Optos - OU - Both Eyes    2. Left epiretinal membrane  H35.372       1.  Quiescent PDR OU.  2.  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  Nonetheless lesion creep that is scars from previous PRP is clearly present and will need to be evaluated over time.  3.  A aphakia OD, can [place] lens implant could be undertaken with suspension of a posterior  chamber lens into the ciliary body via the [place] scleral technique.  This would allow the patient to have some peripheral vision in the right eye to work coincidentally with the good left eye.  Ophthalmic Meds Ordered this visit:  No orders of the defined types were placed in this encounter.      Return in about 1 year (around 05/20/2022) for COLOR FP, DILATE OU, OCT.  There are no Patient Instructions on file for this visit.   Explained the diagnoses, plan, and follow up with the patient and they expressed understanding.  Patient expressed understanding of the importance of proper follow up care.   Clent Demark Elliana Bal M.D. Diseases & Surgery of the Retina and Vitreous Retina & Diabetic [place] 05/20/21     Abbreviations: M myopia (nearsighted); A astigmatism; H hyperopia (farsighted); P presbyopia; Mrx spectacle prescription;  CTL contact lenses; OD right eye; OS left eye; OU both eyes  XT exotropia; ET esotropia; PEK punctate epithelial keratitis; PEE punctate epithelial erosions; DES dry eye syndrome; MGD  meibomian gland dysfunction; ATs artificial tears; PFAT's preservative free artificial tears; [place] nuclear sclerotic cataract; PSC posterior subcapsular cataract; ERM epi-retinal membrane; PVD posterior vitreous detachment; RD retinal detachment; DM diabetes mellitus; DR diabetic retinopathy; NPDR non-proliferative diabetic retinopathy; PDR proliferative diabetic retinopathy; CSME clinically significant macular edema; DME diabetic macular edema; dbh dot blot hemorrhages; CWS cotton wool spot; POAG primary open angle glaucoma; C/D cup-to-disc ratio; HVF humphrey visual field; GVF goldmann visual field; OCT optical coherence tomography; IOP intraocular pressure; BRVO Branch retinal vein occlusion; CRVO central retinal vein occlusion; CRAO central retinal artery occlusion; BRAO branch retinal artery occlusion; RT retinal tear; SB scleral buckle; PPV pars plana vitrectomy; VH Vitreous  hemorrhage; PRP panretinal laser photocoagulation; IVK intravitreal kenalog; VMT vitreomacular traction; MH Macular hole;  NVD neovascularization of the disc; NVE neovascularization elsewhere; AREDS age related eye disease study; ARMD age related macular degeneration; POAG primary open angle glaucoma; EBMD epithelial/anterior basement membrane dystrophy; ACIOL anterior chamber intraocular lens; IOL intraocular lens; PCIOL posterior chamber intraocular lens; Phaco/IOL phacoemulsification with intraocular lens placement; [place] photorefractive keratectomy; LASIK laser assisted in situ keratomileusis; HTN hypertension; DM diabetes mellitus; COPD chronic obstructive pulmonary disease

## 2021-05-20 NOTE — Assessment & Plan Note (Signed)
Quiescent PDR OU for over 24 years.  Some lesion creep previous PRP as all PRP had been initially delivered outside of the temporal arcades.

## 2021-05-21 DIAGNOSIS — E1022 Type 1 diabetes mellitus with diabetic chronic kidney disease: Secondary | ICD-10-CM | POA: Diagnosis not present

## 2021-05-21 DIAGNOSIS — E1042 Type 1 diabetes mellitus with diabetic polyneuropathy: Secondary | ICD-10-CM | POA: Diagnosis not present

## 2021-05-21 DIAGNOSIS — Z940 Kidney transplant status: Secondary | ICD-10-CM | POA: Diagnosis not present

## 2021-05-21 DIAGNOSIS — R339 Retention of urine, unspecified: Secondary | ICD-10-CM | POA: Diagnosis not present

## 2021-05-21 DIAGNOSIS — Z9483 Pancreas transplant status: Secondary | ICD-10-CM | POA: Diagnosis not present

## 2021-05-21 DIAGNOSIS — D849 Immunodeficiency, unspecified: Secondary | ICD-10-CM | POA: Diagnosis not present

## 2021-05-21 DIAGNOSIS — Z949 Transplanted organ and tissue status, unspecified: Secondary | ICD-10-CM | POA: Diagnosis not present

## 2021-05-21 DIAGNOSIS — Z79899 Other long term (current) drug therapy: Secondary | ICD-10-CM | POA: Diagnosis not present

## 2021-05-21 DIAGNOSIS — I951 Orthostatic hypotension: Secondary | ICD-10-CM | POA: Diagnosis not present

## 2021-05-21 DIAGNOSIS — Z4822 Encounter for aftercare following kidney transplant: Secondary | ICD-10-CM | POA: Diagnosis not present

## 2021-05-21 DIAGNOSIS — D84821 Immunodeficiency due to drugs: Secondary | ICD-10-CM | POA: Diagnosis not present

## 2021-05-21 DIAGNOSIS — Z5181 Encounter for therapeutic drug level monitoring: Secondary | ICD-10-CM | POA: Diagnosis not present

## 2021-05-21 DIAGNOSIS — D649 Anemia, unspecified: Secondary | ICD-10-CM | POA: Diagnosis not present

## 2021-06-04 DIAGNOSIS — R197 Diarrhea, unspecified: Secondary | ICD-10-CM | POA: Diagnosis not present

## 2021-06-04 DIAGNOSIS — R17 Unspecified jaundice: Secondary | ICD-10-CM | POA: Diagnosis not present

## 2021-06-04 DIAGNOSIS — Z79899 Other long term (current) drug therapy: Secondary | ICD-10-CM | POA: Diagnosis not present

## 2021-06-04 DIAGNOSIS — E1042 Type 1 diabetes mellitus with diabetic polyneuropathy: Secondary | ICD-10-CM | POA: Diagnosis not present

## 2021-06-04 DIAGNOSIS — Z9489 Other transplanted organ and tissue status: Secondary | ICD-10-CM | POA: Diagnosis not present

## 2021-06-04 DIAGNOSIS — K651 Peritoneal abscess: Secondary | ICD-10-CM | POA: Diagnosis not present

## 2021-06-04 DIAGNOSIS — G909 Disorder of the autonomic nervous system, unspecified: Secondary | ICD-10-CM | POA: Diagnosis not present

## 2021-06-04 DIAGNOSIS — T86898 Other complications of other transplanted tissue: Secondary | ICD-10-CM | POA: Diagnosis not present

## 2021-06-04 DIAGNOSIS — E1043 Type 1 diabetes mellitus with diabetic autonomic (poly)neuropathy: Secondary | ICD-10-CM | POA: Diagnosis not present

## 2021-06-04 DIAGNOSIS — N179 Acute kidney failure, unspecified: Secondary | ICD-10-CM | POA: Diagnosis not present

## 2021-06-04 DIAGNOSIS — I951 Orthostatic hypotension: Secondary | ICD-10-CM | POA: Diagnosis not present

## 2021-06-04 DIAGNOSIS — Z940 Kidney transplant status: Secondary | ICD-10-CM | POA: Diagnosis not present

## 2021-06-04 DIAGNOSIS — Z9483 Pancreas transplant status: Secondary | ICD-10-CM | POA: Diagnosis not present

## 2021-06-04 DIAGNOSIS — E1022 Type 1 diabetes mellitus with diabetic chronic kidney disease: Secondary | ICD-10-CM | POA: Diagnosis not present

## 2021-06-04 DIAGNOSIS — D849 Immunodeficiency, unspecified: Secondary | ICD-10-CM | POA: Diagnosis not present

## 2021-06-04 DIAGNOSIS — R9431 Abnormal electrocardiogram [ECG] [EKG]: Secondary | ICD-10-CM | POA: Diagnosis not present

## 2021-06-04 DIAGNOSIS — Z20822 Contact with and (suspected) exposure to covid-19: Secondary | ICD-10-CM | POA: Diagnosis not present

## 2021-06-04 DIAGNOSIS — D631 Anemia in chronic kidney disease: Secondary | ICD-10-CM | POA: Diagnosis not present

## 2021-06-04 DIAGNOSIS — Z949 Transplanted organ and tissue status, unspecified: Secondary | ICD-10-CM | POA: Diagnosis not present

## 2021-06-04 DIAGNOSIS — R55 Syncope and collapse: Secondary | ICD-10-CM | POA: Diagnosis not present

## 2021-06-04 DIAGNOSIS — R188 Other ascites: Secondary | ICD-10-CM | POA: Diagnosis not present

## 2021-06-04 DIAGNOSIS — R509 Fever, unspecified: Secondary | ICD-10-CM | POA: Diagnosis not present

## 2021-06-04 DIAGNOSIS — Z792 Long term (current) use of antibiotics: Secondary | ICD-10-CM | POA: Diagnosis not present

## 2021-06-06 DIAGNOSIS — Z949 Transplanted organ and tissue status, unspecified: Secondary | ICD-10-CM | POA: Diagnosis not present

## 2021-06-13 DIAGNOSIS — R509 Fever, unspecified: Secondary | ICD-10-CM | POA: Diagnosis not present

## 2021-06-16 DIAGNOSIS — Z79899 Other long term (current) drug therapy: Secondary | ICD-10-CM | POA: Diagnosis not present

## 2021-06-16 DIAGNOSIS — Z48288 Encounter for aftercare following multiple organ transplant: Secondary | ICD-10-CM | POA: Diagnosis not present

## 2021-06-16 DIAGNOSIS — Z940 Kidney transplant status: Secondary | ICD-10-CM | POA: Diagnosis not present

## 2021-06-16 DIAGNOSIS — R339 Retention of urine, unspecified: Secondary | ICD-10-CM | POA: Diagnosis not present

## 2021-06-16 DIAGNOSIS — E1042 Type 1 diabetes mellitus with diabetic polyneuropathy: Secondary | ICD-10-CM | POA: Diagnosis not present

## 2021-06-16 DIAGNOSIS — I10 Essential (primary) hypertension: Secondary | ICD-10-CM | POA: Diagnosis not present

## 2021-06-16 DIAGNOSIS — Z949 Transplanted organ and tissue status, unspecified: Secondary | ICD-10-CM | POA: Diagnosis not present

## 2021-06-16 DIAGNOSIS — E10319 Type 1 diabetes mellitus with unspecified diabetic retinopathy without macular edema: Secondary | ICD-10-CM | POA: Diagnosis not present

## 2021-06-16 DIAGNOSIS — R609 Edema, unspecified: Secondary | ICD-10-CM | POA: Diagnosis not present

## 2021-06-16 DIAGNOSIS — I951 Orthostatic hypotension: Secondary | ICD-10-CM | POA: Diagnosis not present

## 2021-06-16 DIAGNOSIS — Z7952 Long term (current) use of systemic steroids: Secondary | ICD-10-CM | POA: Diagnosis not present

## 2021-06-16 DIAGNOSIS — E109 Type 1 diabetes mellitus without complications: Secondary | ICD-10-CM | POA: Diagnosis not present

## 2021-06-16 DIAGNOSIS — D649 Anemia, unspecified: Secondary | ICD-10-CM | POA: Diagnosis not present

## 2021-06-16 DIAGNOSIS — D8989 Other specified disorders involving the immune mechanism, not elsewhere classified: Secondary | ICD-10-CM | POA: Diagnosis not present

## 2021-06-16 DIAGNOSIS — Z9483 Pancreas transplant status: Secondary | ICD-10-CM | POA: Diagnosis not present

## 2021-06-20 DIAGNOSIS — R509 Fever, unspecified: Secondary | ICD-10-CM | POA: Diagnosis not present

## 2021-06-23 DIAGNOSIS — R509 Fever, unspecified: Secondary | ICD-10-CM | POA: Diagnosis not present

## 2021-06-23 DIAGNOSIS — Z940 Kidney transplant status: Secondary | ICD-10-CM | POA: Diagnosis not present

## 2021-06-23 DIAGNOSIS — Z48288 Encounter for aftercare following multiple organ transplant: Secondary | ICD-10-CM | POA: Diagnosis not present

## 2021-06-23 DIAGNOSIS — I951 Orthostatic hypotension: Secondary | ICD-10-CM | POA: Diagnosis not present

## 2021-06-23 DIAGNOSIS — Z9483 Pancreas transplant status: Secondary | ICD-10-CM | POA: Diagnosis not present

## 2021-06-23 DIAGNOSIS — D849 Immunodeficiency, unspecified: Secondary | ICD-10-CM | POA: Diagnosis not present

## 2021-06-23 DIAGNOSIS — E785 Hyperlipidemia, unspecified: Secondary | ICD-10-CM | POA: Diagnosis not present

## 2021-06-23 DIAGNOSIS — D649 Anemia, unspecified: Secondary | ICD-10-CM | POA: Diagnosis not present

## 2021-06-27 DIAGNOSIS — R509 Fever, unspecified: Secondary | ICD-10-CM | POA: Diagnosis not present

## 2021-06-29 DIAGNOSIS — H608X2 Other otitis externa, left ear: Secondary | ICD-10-CM | POA: Diagnosis not present

## 2021-06-29 DIAGNOSIS — H6123 Impacted cerumen, bilateral: Secondary | ICD-10-CM | POA: Diagnosis not present

## 2021-06-30 DIAGNOSIS — Z4822 Encounter for aftercare following kidney transplant: Secondary | ICD-10-CM | POA: Diagnosis not present

## 2021-06-30 DIAGNOSIS — E1042 Type 1 diabetes mellitus with diabetic polyneuropathy: Secondary | ICD-10-CM | POA: Diagnosis not present

## 2021-06-30 DIAGNOSIS — Z940 Kidney transplant status: Secondary | ICD-10-CM | POA: Diagnosis not present

## 2021-06-30 DIAGNOSIS — I951 Orthostatic hypotension: Secondary | ICD-10-CM | POA: Diagnosis not present

## 2021-06-30 DIAGNOSIS — D849 Immunodeficiency, unspecified: Secondary | ICD-10-CM | POA: Diagnosis not present

## 2021-06-30 DIAGNOSIS — Z9483 Pancreas transplant status: Secondary | ICD-10-CM | POA: Diagnosis not present

## 2021-06-30 DIAGNOSIS — K8689 Other specified diseases of pancreas: Secondary | ICD-10-CM | POA: Diagnosis not present

## 2021-06-30 DIAGNOSIS — Z79899 Other long term (current) drug therapy: Secondary | ICD-10-CM | POA: Diagnosis not present

## 2021-06-30 DIAGNOSIS — D84821 Immunodeficiency due to drugs: Secondary | ICD-10-CM | POA: Diagnosis not present

## 2021-06-30 DIAGNOSIS — Z5181 Encounter for therapeutic drug level monitoring: Secondary | ICD-10-CM | POA: Diagnosis not present

## 2021-06-30 DIAGNOSIS — D702 Other drug-induced agranulocytosis: Secondary | ICD-10-CM | POA: Diagnosis not present

## 2021-07-01 DIAGNOSIS — Z961 Presence of intraocular lens: Secondary | ICD-10-CM | POA: Diagnosis not present

## 2021-07-01 DIAGNOSIS — H401132 Primary open-angle glaucoma, bilateral, moderate stage: Secondary | ICD-10-CM | POA: Diagnosis not present

## 2021-07-08 DIAGNOSIS — I10 Essential (primary) hypertension: Secondary | ICD-10-CM | POA: Diagnosis not present

## 2021-07-08 DIAGNOSIS — K8689 Other specified diseases of pancreas: Secondary | ICD-10-CM | POA: Diagnosis not present

## 2021-07-08 DIAGNOSIS — E10319 Type 1 diabetes mellitus with unspecified diabetic retinopathy without macular edema: Secondary | ICD-10-CM | POA: Diagnosis not present

## 2021-07-08 DIAGNOSIS — Z940 Kidney transplant status: Secondary | ICD-10-CM | POA: Diagnosis not present

## 2021-07-08 DIAGNOSIS — I959 Hypotension, unspecified: Secondary | ICD-10-CM | POA: Diagnosis not present

## 2021-07-08 DIAGNOSIS — Z5181 Encounter for therapeutic drug level monitoring: Secondary | ICD-10-CM | POA: Diagnosis not present

## 2021-07-08 DIAGNOSIS — I951 Orthostatic hypotension: Secondary | ICD-10-CM | POA: Diagnosis not present

## 2021-07-08 DIAGNOSIS — R339 Retention of urine, unspecified: Secondary | ICD-10-CM | POA: Diagnosis not present

## 2021-07-08 DIAGNOSIS — Z9483 Pancreas transplant status: Secondary | ICD-10-CM | POA: Diagnosis not present

## 2021-07-08 DIAGNOSIS — D708 Other neutropenia: Secondary | ICD-10-CM | POA: Diagnosis not present

## 2021-07-08 DIAGNOSIS — D649 Anemia, unspecified: Secondary | ICD-10-CM | POA: Diagnosis not present

## 2021-07-08 DIAGNOSIS — D849 Immunodeficiency, unspecified: Secondary | ICD-10-CM | POA: Diagnosis not present

## 2021-07-08 DIAGNOSIS — E1042 Type 1 diabetes mellitus with diabetic polyneuropathy: Secondary | ICD-10-CM | POA: Diagnosis not present

## 2021-07-08 DIAGNOSIS — Z4822 Encounter for aftercare following kidney transplant: Secondary | ICD-10-CM | POA: Diagnosis not present

## 2021-07-08 DIAGNOSIS — Z48288 Encounter for aftercare following multiple organ transplant: Secondary | ICD-10-CM | POA: Diagnosis not present

## 2021-07-13 DIAGNOSIS — G4733 Obstructive sleep apnea (adult) (pediatric): Secondary | ICD-10-CM | POA: Diagnosis not present

## 2021-07-13 DIAGNOSIS — K219 Gastro-esophageal reflux disease without esophagitis: Secondary | ICD-10-CM | POA: Diagnosis not present

## 2021-07-13 DIAGNOSIS — Z825 Family history of asthma and other chronic lower respiratory diseases: Secondary | ICD-10-CM | POA: Diagnosis not present

## 2021-07-13 DIAGNOSIS — Z809 Family history of malignant neoplasm, unspecified: Secondary | ICD-10-CM | POA: Diagnosis not present

## 2021-07-13 DIAGNOSIS — Z811 Family history of alcohol abuse and dependence: Secondary | ICD-10-CM | POA: Diagnosis not present

## 2021-07-13 DIAGNOSIS — Z8249 Family history of ischemic heart disease and other diseases of the circulatory system: Secondary | ICD-10-CM | POA: Diagnosis not present

## 2021-07-13 DIAGNOSIS — G629 Polyneuropathy, unspecified: Secondary | ICD-10-CM | POA: Diagnosis not present

## 2021-07-13 DIAGNOSIS — F329 Major depressive disorder, single episode, unspecified: Secondary | ICD-10-CM | POA: Diagnosis not present

## 2021-07-13 DIAGNOSIS — Z818 Family history of other mental and behavioral disorders: Secondary | ICD-10-CM | POA: Diagnosis not present

## 2021-07-15 DIAGNOSIS — D849 Immunodeficiency, unspecified: Secondary | ICD-10-CM | POA: Diagnosis not present

## 2021-07-15 DIAGNOSIS — I951 Orthostatic hypotension: Secondary | ICD-10-CM | POA: Diagnosis not present

## 2021-07-15 DIAGNOSIS — D72819 Decreased white blood cell count, unspecified: Secondary | ICD-10-CM | POA: Diagnosis not present

## 2021-07-15 DIAGNOSIS — D708 Other neutropenia: Secondary | ICD-10-CM | POA: Diagnosis not present

## 2021-07-15 DIAGNOSIS — Z9483 Pancreas transplant status: Secondary | ICD-10-CM | POA: Diagnosis not present

## 2021-07-15 DIAGNOSIS — Z940 Kidney transplant status: Secondary | ICD-10-CM | POA: Diagnosis not present

## 2021-07-15 DIAGNOSIS — K8689 Other specified diseases of pancreas: Secondary | ICD-10-CM | POA: Diagnosis not present

## 2021-07-15 DIAGNOSIS — E1042 Type 1 diabetes mellitus with diabetic polyneuropathy: Secondary | ICD-10-CM | POA: Diagnosis not present

## 2021-07-15 DIAGNOSIS — D649 Anemia, unspecified: Secondary | ICD-10-CM | POA: Diagnosis not present

## 2021-07-15 DIAGNOSIS — Z79899 Other long term (current) drug therapy: Secondary | ICD-10-CM | POA: Diagnosis not present

## 2021-07-15 DIAGNOSIS — R188 Other ascites: Secondary | ICD-10-CM | POA: Diagnosis not present

## 2021-07-15 DIAGNOSIS — E785 Hyperlipidemia, unspecified: Secondary | ICD-10-CM | POA: Diagnosis not present

## 2021-07-15 DIAGNOSIS — R339 Retention of urine, unspecified: Secondary | ICD-10-CM | POA: Diagnosis not present

## 2021-07-15 DIAGNOSIS — Z23 Encounter for immunization: Secondary | ICD-10-CM | POA: Diagnosis not present

## 2021-07-15 DIAGNOSIS — Z4822 Encounter for aftercare following kidney transplant: Secondary | ICD-10-CM | POA: Diagnosis not present

## 2021-07-21 DIAGNOSIS — D631 Anemia in chronic kidney disease: Secondary | ICD-10-CM | POA: Diagnosis not present

## 2021-07-21 DIAGNOSIS — Z79899 Other long term (current) drug therapy: Secondary | ICD-10-CM | POA: Diagnosis not present

## 2021-07-21 DIAGNOSIS — E8779 Other fluid overload: Secondary | ICD-10-CM | POA: Diagnosis not present

## 2021-07-21 DIAGNOSIS — K8502 Idiopathic acute pancreatitis with infected necrosis: Secondary | ICD-10-CM | POA: Diagnosis not present

## 2021-07-21 DIAGNOSIS — K8592 Acute pancreatitis with infected necrosis, unspecified: Secondary | ICD-10-CM | POA: Diagnosis not present

## 2021-07-21 DIAGNOSIS — T86898 Other complications of other transplanted tissue: Secondary | ICD-10-CM | POA: Diagnosis not present

## 2021-07-21 DIAGNOSIS — K8689 Other specified diseases of pancreas: Secondary | ICD-10-CM | POA: Diagnosis not present

## 2021-07-21 DIAGNOSIS — R509 Fever, unspecified: Secondary | ICD-10-CM | POA: Diagnosis not present

## 2021-07-21 DIAGNOSIS — Z9483 Pancreas transplant status: Secondary | ICD-10-CM | POA: Diagnosis not present

## 2021-07-21 DIAGNOSIS — Z940 Kidney transplant status: Secondary | ICD-10-CM | POA: Diagnosis not present

## 2021-07-21 DIAGNOSIS — Z789 Other specified health status: Secondary | ICD-10-CM | POA: Diagnosis not present

## 2021-07-21 DIAGNOSIS — E1022 Type 1 diabetes mellitus with diabetic chronic kidney disease: Secondary | ICD-10-CM | POA: Diagnosis not present

## 2021-07-21 DIAGNOSIS — D849 Immunodeficiency, unspecified: Secondary | ICD-10-CM | POA: Diagnosis not present

## 2021-07-21 DIAGNOSIS — I129 Hypertensive chronic kidney disease with stage 1 through stage 4 chronic kidney disease, or unspecified chronic kidney disease: Secondary | ICD-10-CM | POA: Diagnosis not present

## 2021-07-21 DIAGNOSIS — N189 Chronic kidney disease, unspecified: Secondary | ICD-10-CM | POA: Diagnosis not present

## 2021-07-21 DIAGNOSIS — I151 Hypertension secondary to other renal disorders: Secondary | ICD-10-CM | POA: Diagnosis not present

## 2021-07-21 DIAGNOSIS — E8729 Other acidosis: Secondary | ICD-10-CM | POA: Diagnosis not present

## 2021-07-21 DIAGNOSIS — N186 End stage renal disease: Secondary | ICD-10-CM | POA: Diagnosis not present

## 2021-07-21 DIAGNOSIS — K8591 Acute pancreatitis with uninfected necrosis, unspecified: Secondary | ICD-10-CM | POA: Diagnosis not present

## 2021-07-24 DIAGNOSIS — K8502 Idiopathic acute pancreatitis with infected necrosis: Secondary | ICD-10-CM | POA: Diagnosis not present

## 2021-07-27 NOTE — Progress Notes (Signed)
PATIENT: Dillon Lane DOB: November 11, 1960  REASON FOR VISIT: follow up HISTORY FROM: patient  Virtual Visit via Video Note  I connected with Dillon Lane on 07/28/21 at  1:30 PM EDT by a video enabled telemedicine application located remotely at [facility] and verified that I am speaking with the correct person using two identifiers who was located at their own home.   I discussed the limitations of evaluation and management by telemedicine and the availability of in person appointments. The patient expressed understanding and agreed to proceed.   PATIENT: Dillon Lane DOB: [birth year]  REASON FOR VISIT: follow up HISTORY FROM: patient  HISTORY OF PRESENT ILLNESS: Today 07/28/21:  Dillon Lane is a 60 year old male with a history of OSA on CPAP. Donwload is below. Reports that he is trying ot go to bed earlier. 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 Has recently been hospitalized due to infection related to transplant. On IV ABX    07/23/20: Dillon Lane is a 60 year old male with a history of OSA on CPAP. He returns today for follow-up. Download indicates that he used machine 24/30 days for compliance of 80%. Used machine >4 hours 21/30 days for compliance of 70%. Residual AHI 1.5 on 14cm H20 with EPR 3. Leak in the 95th percentile is 31.5 %. Overall doing well with CPAP. On transplant list for pancreas and kidney. Itching at night which keeps him awake some nights- seeing dermatology.   HISTORY 07/24/19:   Dillon Lane is a 60 year old male with a history of obstructive sleep apnea on CPAP.  His download indicates that he uses machine 24 out of 30 days for compliance of 80%.  He uses machine greater than 4 hours 19 days for compliance of 63%.  On average he uses his machine 5 hours and 50 minutes.  His residual AHI is 1.7 on 14 cm of water with EPR of 3.  His leak in the 95th percentile is 25.2 L/min.  He currently has the nasal pillows.  He does not feel his mask  leaking at night.  He does not have a set bedtime routine.  He reports that he has been diagnosed with stage IV kidney disease.  [name]  He returns today for an evaluation.  REVIEW OF SYSTEMS: Out of a complete 14 system review of symptoms, the patient complains only of the following symptoms, and all other reviewed systems are negative.  See HPI  ALLERGIES: Allergies  Allergen Reactions   Tetracycline Shortness Of Breath    "Swelling"   Tetracyclines & Related Shortness Of Breath and Swelling   Milk-Related Compounds Diarrhea    Bloating, cramping   Nsaids     Due to poor kidney function    HOME MEDICATIONS: Outpatient Medications Prior to Visit  Medication Sig Dispense Refill   AMLODIPINE BENZOATE PO Take 5 mg by mouth every evening.     aspirin 81 MG tablet Take 81 mg by mouth daily.     bimatoprost (LUMIGAN) 0.01 % SOLN Place 1 drop into both eyes at bedtime.     Carboxymethylcellul-Glycerin (LUBRICATING EYE DROPS OP) Place 1 drop into the right eye daily as needed (dry eyes).     cetirizine (ZYRTEC) 10 MG tablet Take 10 mg by mouth daily as needed for allergies.      Cholecalciferol (VITAMIN D) 50 MCG (2000 UT) tablet Take 2,000 Units by mouth daily.     Colloidal Oatmeal (GOLD BOND ECZEMA RELIEF EX)  Apply 1 application topically daily as needed (eczema).     Emollient (UDDERLY SMOOTH) CREA Apply 1 application topically daily as needed (itching).     furosemide (LASIX) 40 MG tablet Take 40 mg by mouth daily.     HUMALOG 100 UNIT/ML injection Inject 45-50 Units into the skin daily. Uses in insulin pump averages about 45-50 units per day  12   hydrALAZINE (APRESOLINE) 50 MG tablet Take 50 mg by mouth 3 (three) times daily.      HYDROcodone-acetaminophen (NORCO) 5-325 MG tablet Take 1 tablet by mouth every 6 (six) hours as needed. (Patient not taking: Reported on 02/25/2020) 8 tablet 0   Lactase (LACTAID PO) Take 2 tablets by mouth daily as needed (consuming milk).      rosuvastatin  (CRESTOR) 20 MG tablet Take 20 mg by mouth every Monday.      timolol (TIMOPTIC) 0.5 % ophthalmic solution Place 1 drop into both eyes daily.      Timolol Maleate PF 0.5 % SOLN Place 1 drop into both eyes daily.     No facility-administered medications prior to visit.    PAST MEDICAL HISTORY: Past Medical History:  Diagnosis Date   Allergy    fall   Cancer [facility])    pt states he has never been diagnosed    Chronic kidney disease    stage 4   Depression    05-2016- no meds    Diabetes mellitus without complication (HCC)    DM type 1, on insulin pump.   Glaucoma    Glaucoma    Hyperlipidemia    Hypertension    OSA on CPAP    Osteoporosis    PONV (postoperative nausea and vomiting)    1 time got nauseated, then passed out (one of the eye surgeries)   Retinopathy    20/200 in right eye   Sleep apnea    wears cpap   Syncope and collapse    states he can cough and pass out    PAST SURGICAL HISTORY: Past Surgical History:  Procedure Laterality Date   AV FISTULA PLACEMENT Right 11/08/2019   Procedure: Creation of RIGHT ARM Radiocephalic ARTERIOVENOUS (AV) FISTULA;  Surgeon: Waynetta Sandy, MD;  Location: [place];  Service: Vascular;  Laterality: Right;   CATARACT EXTRACTION  1994   left eye   COLONOSCOPY     DG BARIUM SWALLOW ([place] HX)  1994   EYE SURGERY     x3 with 9 sessions of lasers for retinopathy.   glaucoma surgery     Dr Maylene Roes   HAND SURGERY  2 years ago   x2   POLYPECTOMY     VITRECTOMY     x3    FAMILY HISTORY: Family History  Problem Relation Age of Onset   Brain cancer Mother    Prostate cancer Father    Colon cancer Neg Hx    Colon polyps Neg Hx    Rectal cancer Neg Hx    Stomach cancer Neg Hx    Esophageal cancer Neg Hx     SOCIAL HISTORY: Social History   Socioeconomic History   Marital status: Married    Spouse name: Not on file   Number of children: 2   Years of education: HS   Highest education level: Not on file   Occupational History   Occupation: Disability   Tobacco Use   Smoking status: Never   Smokeless tobacco: Never  Vaping Use   Vaping Use: Never used  Substance and Sexual Activity   Alcohol use: No   Drug use: No   Sexual activity: Not on file  Other Topics Concern   Not on file  Social History Narrative   Denies caffeine use    Social Determinants of Health   Financial Resource Strain: Not on file  Food Insecurity: Not on file  Transportation Needs: Not on file  Physical Activity: Not on file  Stress: Not on file  Social Connections: Not on file  Intimate Partner Violence: Not on file      PHYSICAL EXAM Generalized: Well developed, in no acute distress   Neurological examination  Mentation: Alert oriented to time, place, history taking. Follows all commands speech and language fluent Cranial nerve II-XII:Extraocular movements were full. Facial symmetry noted. uvula tongue midline. Head turning and shoulder shrug  were normal and symmetric. Motor: Good strength throughout subjectively per patient Sensory: Sensory testing is intact to soft touch on all 4 extremities subjectively per patient Coordination: Cerebellar testing reveals good finger-nose-finger  Gait and station: Patient is able to stand from a seated position. gait is normal.  Reflexes: UTA  DIAGNOSTIC DATA (LABS, IMAGING, TESTING) - I reviewed patient records, labs, notes, testing and imaging myself where available.  Lab Results  Component Value Date   HGB 12.9 (L) 11/08/2019   HCT 38.0 (L) 11/08/2019      Component Value Date/Time   NA 131 (L) 11/08/2019 0933   K 4.2 11/08/2019 0933   CL 95 (L) 11/08/2019 0933   GLUCOSE 167 (H) 11/08/2019 0933   BUN 54 (H) 11/08/2019 0933   CREATININE 4.90 (H) 11/08/2019 0933      ASSESSMENT AND PLAN 60 y.o. year old male  has a past medical history of Allergy, Cancer ([place]), Chronic kidney disease, Depression, Diabetes mellitus without complication ([place]),  Glaucoma, Glaucoma, Hyperlipidemia, Hypertension, OSA on CPAP, Osteoporosis, PONV (postoperative nausea and vomiting), Retinopathy, Sleep apnea, and Syncope and collapse. here with:  OSA on CPAP  CPAP compliance excellent Residual AHI is good Encouraged patient to continue using CPAP nightly and > 4 hours each night F/U in 1 year or sooner if needed   Ward Givens, MSN, NP-C 07/28/2021, 11:47 AM [facility] [address] [place], [place] [address] [phone]

## 2021-07-28 ENCOUNTER — Telehealth (INDEPENDENT_AMBULATORY_CARE_PROVIDER_SITE_OTHER): Payer: Medicare PPO | Admitting: Adult Health

## 2021-07-28 VITALS — BP 116/56 | HR 57 | Wt 170.0 lb

## 2021-07-28 DIAGNOSIS — G4733 Obstructive sleep apnea (adult) (pediatric): Secondary | ICD-10-CM | POA: Diagnosis not present

## 2021-07-28 DIAGNOSIS — Z9989 Dependence on other enabling machines and devices: Secondary | ICD-10-CM

## 2021-07-30 DIAGNOSIS — Z949 Transplanted organ and tissue status, unspecified: Secondary | ICD-10-CM | POA: Diagnosis not present

## 2021-07-30 DIAGNOSIS — Z5181 Encounter for therapeutic drug level monitoring: Secondary | ICD-10-CM | POA: Diagnosis not present

## 2021-07-30 DIAGNOSIS — Z9483 Pancreas transplant status: Secondary | ICD-10-CM | POA: Diagnosis not present

## 2021-07-30 DIAGNOSIS — E109 Type 1 diabetes mellitus without complications: Secondary | ICD-10-CM | POA: Diagnosis not present

## 2021-07-30 DIAGNOSIS — Z79621 Long term (current) use of calcineurin inhibitor: Secondary | ICD-10-CM | POA: Diagnosis not present

## 2021-07-30 DIAGNOSIS — E785 Hyperlipidemia, unspecified: Secondary | ICD-10-CM | POA: Diagnosis not present

## 2021-07-30 DIAGNOSIS — K8591 Acute pancreatitis with uninfected necrosis, unspecified: Secondary | ICD-10-CM | POA: Diagnosis not present

## 2021-07-30 DIAGNOSIS — D72819 Decreased white blood cell count, unspecified: Secondary | ICD-10-CM | POA: Diagnosis not present

## 2021-07-30 DIAGNOSIS — D708 Other neutropenia: Secondary | ICD-10-CM | POA: Diagnosis not present

## 2021-07-30 DIAGNOSIS — D649 Anemia, unspecified: Secondary | ICD-10-CM | POA: Diagnosis not present

## 2021-07-30 DIAGNOSIS — Z7952 Long term (current) use of systemic steroids: Secondary | ICD-10-CM | POA: Diagnosis not present

## 2021-07-30 DIAGNOSIS — N2889 Other specified disorders of kidney and ureter: Secondary | ICD-10-CM | POA: Diagnosis not present

## 2021-07-30 DIAGNOSIS — I951 Orthostatic hypotension: Secondary | ICD-10-CM | POA: Diagnosis not present

## 2021-07-30 DIAGNOSIS — I151 Hypertension secondary to other renal disorders: Secondary | ICD-10-CM | POA: Diagnosis not present

## 2021-07-30 DIAGNOSIS — D849 Immunodeficiency, unspecified: Secondary | ICD-10-CM | POA: Diagnosis not present

## 2021-07-30 DIAGNOSIS — Z940 Kidney transplant status: Secondary | ICD-10-CM | POA: Diagnosis not present

## 2021-07-30 DIAGNOSIS — K8689 Other specified diseases of pancreas: Secondary | ICD-10-CM | POA: Diagnosis not present

## 2021-08-01 DIAGNOSIS — K8502 Idiopathic acute pancreatitis with infected necrosis: Secondary | ICD-10-CM | POA: Diagnosis not present

## 2021-08-06 DIAGNOSIS — D649 Anemia, unspecified: Secondary | ICD-10-CM | POA: Diagnosis not present

## 2021-08-06 DIAGNOSIS — D72819 Decreased white blood cell count, unspecified: Secondary | ICD-10-CM | POA: Diagnosis not present

## 2021-08-06 DIAGNOSIS — Z4822 Encounter for aftercare following kidney transplant: Secondary | ICD-10-CM | POA: Diagnosis not present

## 2021-08-06 DIAGNOSIS — Z940 Kidney transplant status: Secondary | ICD-10-CM | POA: Diagnosis not present

## 2021-08-06 DIAGNOSIS — K8591 Acute pancreatitis with uninfected necrosis, unspecified: Secondary | ICD-10-CM | POA: Diagnosis not present

## 2021-08-06 DIAGNOSIS — R339 Retention of urine, unspecified: Secondary | ICD-10-CM | POA: Diagnosis not present

## 2021-08-06 DIAGNOSIS — R002 Palpitations: Secondary | ICD-10-CM | POA: Diagnosis not present

## 2021-08-06 DIAGNOSIS — Z5181 Encounter for therapeutic drug level monitoring: Secondary | ICD-10-CM | POA: Diagnosis not present

## 2021-08-06 DIAGNOSIS — K8689 Other specified diseases of pancreas: Secondary | ICD-10-CM | POA: Diagnosis not present

## 2021-08-06 DIAGNOSIS — D849 Immunodeficiency, unspecified: Secondary | ICD-10-CM | POA: Diagnosis not present

## 2021-08-06 DIAGNOSIS — E109 Type 1 diabetes mellitus without complications: Secondary | ICD-10-CM | POA: Diagnosis not present

## 2021-08-06 DIAGNOSIS — Z48288 Encounter for aftercare following multiple organ transplant: Secondary | ICD-10-CM | POA: Diagnosis not present

## 2021-08-06 DIAGNOSIS — D708 Other neutropenia: Secondary | ICD-10-CM | POA: Diagnosis not present

## 2021-08-06 DIAGNOSIS — I951 Orthostatic hypotension: Secondary | ICD-10-CM | POA: Diagnosis not present

## 2021-08-06 DIAGNOSIS — Z949 Transplanted organ and tissue status, unspecified: Secondary | ICD-10-CM | POA: Diagnosis not present

## 2021-08-06 DIAGNOSIS — Z9483 Pancreas transplant status: Secondary | ICD-10-CM | POA: Diagnosis not present

## 2021-08-08 DIAGNOSIS — K8502 Idiopathic acute pancreatitis with infected necrosis: Secondary | ICD-10-CM | POA: Diagnosis not present

## 2021-08-10 DIAGNOSIS — L819 Disorder of pigmentation, unspecified: Secondary | ICD-10-CM | POA: Diagnosis not present

## 2021-08-10 DIAGNOSIS — L814 Other melanin hyperpigmentation: Secondary | ICD-10-CM | POA: Diagnosis not present

## 2021-08-10 DIAGNOSIS — L821 Other seborrheic keratosis: Secondary | ICD-10-CM | POA: Diagnosis not present

## 2021-08-10 DIAGNOSIS — D225 Melanocytic nevi of trunk: Secondary | ICD-10-CM | POA: Diagnosis not present

## 2021-08-12 DIAGNOSIS — Z9483 Pancreas transplant status: Secondary | ICD-10-CM | POA: Diagnosis not present

## 2021-08-12 DIAGNOSIS — K8591 Acute pancreatitis with uninfected necrosis, unspecified: Secondary | ICD-10-CM | POA: Diagnosis not present

## 2021-08-12 DIAGNOSIS — D849 Immunodeficiency, unspecified: Secondary | ICD-10-CM | POA: Diagnosis not present

## 2021-08-12 DIAGNOSIS — D649 Anemia, unspecified: Secondary | ICD-10-CM | POA: Diagnosis not present

## 2021-08-12 DIAGNOSIS — I151 Hypertension secondary to other renal disorders: Secondary | ICD-10-CM | POA: Diagnosis not present

## 2021-08-12 DIAGNOSIS — E785 Hyperlipidemia, unspecified: Secondary | ICD-10-CM | POA: Diagnosis not present

## 2021-08-12 DIAGNOSIS — N2889 Other specified disorders of kidney and ureter: Secondary | ICD-10-CM | POA: Diagnosis not present

## 2021-08-12 DIAGNOSIS — Z48288 Encounter for aftercare following multiple organ transplant: Secondary | ICD-10-CM | POA: Diagnosis not present

## 2021-08-12 DIAGNOSIS — I951 Orthostatic hypotension: Secondary | ICD-10-CM | POA: Diagnosis not present

## 2021-08-12 DIAGNOSIS — Z940 Kidney transplant status: Secondary | ICD-10-CM | POA: Diagnosis not present

## 2021-08-12 DIAGNOSIS — Z79899 Other long term (current) drug therapy: Secondary | ICD-10-CM | POA: Diagnosis not present

## 2021-08-13 DIAGNOSIS — K8502 Idiopathic acute pancreatitis with infected necrosis: Secondary | ICD-10-CM | POA: Diagnosis not present

## 2021-08-19 DIAGNOSIS — I951 Orthostatic hypotension: Secondary | ICD-10-CM | POA: Diagnosis not present

## 2021-08-19 DIAGNOSIS — Z79621 Long term (current) use of calcineurin inhibitor: Secondary | ICD-10-CM | POA: Diagnosis not present

## 2021-08-19 DIAGNOSIS — Z9483 Pancreas transplant status: Secondary | ICD-10-CM | POA: Diagnosis not present

## 2021-08-19 DIAGNOSIS — D849 Immunodeficiency, unspecified: Secondary | ICD-10-CM | POA: Diagnosis not present

## 2021-08-19 DIAGNOSIS — Z881 Allergy status to other antibiotic agents status: Secondary | ICD-10-CM | POA: Diagnosis not present

## 2021-08-19 DIAGNOSIS — Z940 Kidney transplant status: Secondary | ICD-10-CM | POA: Diagnosis not present

## 2021-08-19 DIAGNOSIS — Z5181 Encounter for therapeutic drug level monitoring: Secondary | ICD-10-CM | POA: Diagnosis not present

## 2021-08-19 DIAGNOSIS — N186 End stage renal disease: Secondary | ICD-10-CM | POA: Diagnosis not present

## 2021-08-19 DIAGNOSIS — Z4822 Encounter for aftercare following kidney transplant: Secondary | ICD-10-CM | POA: Diagnosis not present

## 2021-08-20 DIAGNOSIS — K219 Gastro-esophageal reflux disease without esophagitis: Secondary | ICD-10-CM | POA: Diagnosis not present

## 2021-08-20 DIAGNOSIS — G5601 Carpal tunnel syndrome, right upper limb: Secondary | ICD-10-CM | POA: Diagnosis not present

## 2021-08-20 DIAGNOSIS — Z940 Kidney transplant status: Secondary | ICD-10-CM | POA: Diagnosis not present

## 2021-08-20 DIAGNOSIS — G4733 Obstructive sleep apnea (adult) (pediatric): Secondary | ICD-10-CM | POA: Diagnosis not present

## 2021-08-20 DIAGNOSIS — Z794 Long term (current) use of insulin: Secondary | ICD-10-CM | POA: Diagnosis not present

## 2021-08-20 DIAGNOSIS — K8502 Idiopathic acute pancreatitis with infected necrosis: Secondary | ICD-10-CM | POA: Diagnosis not present

## 2021-08-20 DIAGNOSIS — E1022 Type 1 diabetes mellitus with diabetic chronic kidney disease: Secondary | ICD-10-CM | POA: Diagnosis not present

## 2021-08-20 DIAGNOSIS — I120 Hypertensive chronic kidney disease with stage 5 chronic kidney disease or end stage renal disease: Secondary | ICD-10-CM | POA: Diagnosis not present

## 2021-08-20 DIAGNOSIS — R002 Palpitations: Secondary | ICD-10-CM | POA: Diagnosis not present

## 2021-08-20 DIAGNOSIS — G5603 Carpal tunnel syndrome, bilateral upper limbs: Secondary | ICD-10-CM | POA: Diagnosis not present

## 2021-08-20 DIAGNOSIS — N186 End stage renal disease: Secondary | ICD-10-CM | POA: Diagnosis not present

## 2021-08-27 DIAGNOSIS — K8502 Idiopathic acute pancreatitis with infected necrosis: Secondary | ICD-10-CM | POA: Diagnosis not present

## 2021-09-03 DIAGNOSIS — G5602 Carpal tunnel syndrome, left upper limb: Secondary | ICD-10-CM | POA: Diagnosis not present

## 2021-09-03 DIAGNOSIS — Z79899 Other long term (current) drug therapy: Secondary | ICD-10-CM | POA: Diagnosis not present

## 2021-09-03 DIAGNOSIS — K859 Acute pancreatitis without necrosis or infection, unspecified: Secondary | ICD-10-CM | POA: Diagnosis not present

## 2021-09-03 DIAGNOSIS — R918 Other nonspecific abnormal finding of lung field: Secondary | ICD-10-CM | POA: Diagnosis not present

## 2021-09-03 DIAGNOSIS — D849 Immunodeficiency, unspecified: Secondary | ICD-10-CM | POA: Diagnosis not present

## 2021-09-03 DIAGNOSIS — I951 Orthostatic hypotension: Secondary | ICD-10-CM | POA: Diagnosis not present

## 2021-09-03 DIAGNOSIS — N2889 Other specified disorders of kidney and ureter: Secondary | ICD-10-CM | POA: Diagnosis not present

## 2021-09-03 DIAGNOSIS — K8591 Acute pancreatitis with uninfected necrosis, unspecified: Secondary | ICD-10-CM | POA: Diagnosis not present

## 2021-09-03 DIAGNOSIS — T86898 Other complications of other transplanted tissue: Secondary | ICD-10-CM | POA: Diagnosis not present

## 2021-09-03 DIAGNOSIS — Z940 Kidney transplant status: Secondary | ICD-10-CM | POA: Diagnosis not present

## 2021-09-03 DIAGNOSIS — Z9483 Pancreas transplant status: Secondary | ICD-10-CM | POA: Diagnosis not present

## 2021-09-03 DIAGNOSIS — I151 Hypertension secondary to other renal disorders: Secondary | ICD-10-CM | POA: Diagnosis not present

## 2021-09-04 DIAGNOSIS — K8502 Idiopathic acute pancreatitis with infected necrosis: Secondary | ICD-10-CM | POA: Diagnosis not present

## 2021-09-15 DIAGNOSIS — R339 Retention of urine, unspecified: Secondary | ICD-10-CM | POA: Diagnosis not present

## 2021-09-15 DIAGNOSIS — Z9483 Pancreas transplant status: Secondary | ICD-10-CM | POA: Diagnosis not present

## 2021-09-15 DIAGNOSIS — Z4822 Encounter for aftercare following kidney transplant: Secondary | ICD-10-CM | POA: Diagnosis not present

## 2021-09-15 DIAGNOSIS — T86898 Other complications of other transplanted tissue: Secondary | ICD-10-CM | POA: Diagnosis not present

## 2021-09-15 DIAGNOSIS — I951 Orthostatic hypotension: Secondary | ICD-10-CM | POA: Diagnosis not present

## 2021-09-15 DIAGNOSIS — R002 Palpitations: Secondary | ICD-10-CM | POA: Diagnosis not present

## 2021-09-15 DIAGNOSIS — D84821 Immunodeficiency due to drugs: Secondary | ICD-10-CM | POA: Diagnosis not present

## 2021-09-15 DIAGNOSIS — Z940 Kidney transplant status: Secondary | ICD-10-CM | POA: Diagnosis not present

## 2021-09-15 DIAGNOSIS — K859 Acute pancreatitis without necrosis or infection, unspecified: Secondary | ICD-10-CM | POA: Diagnosis not present

## 2021-09-15 DIAGNOSIS — D849 Immunodeficiency, unspecified: Secondary | ICD-10-CM | POA: Diagnosis not present

## 2021-09-15 DIAGNOSIS — R112 Nausea with vomiting, unspecified: Secondary | ICD-10-CM | POA: Diagnosis not present

## 2021-09-22 DIAGNOSIS — Z940 Kidney transplant status: Secondary | ICD-10-CM | POA: Diagnosis not present

## 2021-09-22 DIAGNOSIS — Z452 Encounter for adjustment and management of vascular access device: Secondary | ICD-10-CM | POA: Diagnosis not present

## 2021-09-22 DIAGNOSIS — D849 Immunodeficiency, unspecified: Secondary | ICD-10-CM | POA: Diagnosis not present

## 2021-09-22 DIAGNOSIS — E785 Hyperlipidemia, unspecified: Secondary | ICD-10-CM | POA: Diagnosis not present

## 2021-09-22 DIAGNOSIS — E109 Type 1 diabetes mellitus without complications: Secondary | ICD-10-CM | POA: Diagnosis not present

## 2021-09-22 DIAGNOSIS — D649 Anemia, unspecified: Secondary | ICD-10-CM | POA: Diagnosis not present

## 2021-09-22 DIAGNOSIS — Z7952 Long term (current) use of systemic steroids: Secondary | ICD-10-CM | POA: Diagnosis not present

## 2021-09-22 DIAGNOSIS — Z5181 Encounter for therapeutic drug level monitoring: Secondary | ICD-10-CM | POA: Diagnosis not present

## 2021-09-22 DIAGNOSIS — I951 Orthostatic hypotension: Secondary | ICD-10-CM | POA: Diagnosis not present

## 2021-09-22 DIAGNOSIS — D72819 Decreased white blood cell count, unspecified: Secondary | ICD-10-CM | POA: Diagnosis not present

## 2021-09-22 DIAGNOSIS — K8591 Acute pancreatitis with uninfected necrosis, unspecified: Secondary | ICD-10-CM | POA: Diagnosis not present

## 2021-09-22 DIAGNOSIS — Z4822 Encounter for aftercare following kidney transplant: Secondary | ICD-10-CM | POA: Diagnosis not present

## 2021-09-22 DIAGNOSIS — Z9483 Pancreas transplant status: Secondary | ICD-10-CM | POA: Diagnosis not present

## 2021-09-22 DIAGNOSIS — D708 Other neutropenia: Secondary | ICD-10-CM | POA: Diagnosis not present

## 2021-09-22 DIAGNOSIS — Z79899 Other long term (current) drug therapy: Secondary | ICD-10-CM | POA: Diagnosis not present

## 2021-09-23 DIAGNOSIS — Z1339 Encounter for screening examination for other mental health and behavioral disorders: Secondary | ICD-10-CM | POA: Diagnosis not present

## 2021-09-23 DIAGNOSIS — Z794 Long term (current) use of insulin: Secondary | ICD-10-CM | POA: Diagnosis not present

## 2021-09-23 DIAGNOSIS — I739 Peripheral vascular disease, unspecified: Secondary | ICD-10-CM | POA: Diagnosis not present

## 2021-09-23 DIAGNOSIS — E1029 Type 1 diabetes mellitus with other diabetic kidney complication: Secondary | ICD-10-CM | POA: Diagnosis not present

## 2021-09-23 DIAGNOSIS — G629 Polyneuropathy, unspecified: Secondary | ICD-10-CM | POA: Diagnosis not present

## 2021-09-23 DIAGNOSIS — E1039 Type 1 diabetes mellitus with other diabetic ophthalmic complication: Secondary | ICD-10-CM | POA: Diagnosis not present

## 2021-09-23 DIAGNOSIS — I120 Hypertensive chronic kidney disease with stage 5 chronic kidney disease or end stage renal disease: Secondary | ICD-10-CM | POA: Diagnosis not present

## 2021-09-23 DIAGNOSIS — Z1331 Encounter for screening for depression: Secondary | ICD-10-CM | POA: Diagnosis not present

## 2021-09-25 DIAGNOSIS — I471 Supraventricular tachycardia: Secondary | ICD-10-CM | POA: Diagnosis not present

## 2021-09-25 DIAGNOSIS — I491 Atrial premature depolarization: Secondary | ICD-10-CM | POA: Diagnosis not present

## 2021-09-28 DIAGNOSIS — R509 Fever, unspecified: Secondary | ICD-10-CM | POA: Diagnosis not present

## 2021-09-28 DIAGNOSIS — R110 Nausea: Secondary | ICD-10-CM | POA: Diagnosis not present

## 2021-09-28 DIAGNOSIS — E10319 Type 1 diabetes mellitus with unspecified diabetic retinopathy without macular edema: Secondary | ICD-10-CM | POA: Diagnosis not present

## 2021-09-28 DIAGNOSIS — Z940 Kidney transplant status: Secondary | ICD-10-CM | POA: Diagnosis not present

## 2021-09-28 DIAGNOSIS — T8613 Kidney transplant infection: Secondary | ICD-10-CM | POA: Diagnosis not present

## 2021-09-28 DIAGNOSIS — R197 Diarrhea, unspecified: Secondary | ICD-10-CM | POA: Diagnosis not present

## 2021-09-28 DIAGNOSIS — R109 Unspecified abdominal pain: Secondary | ICD-10-CM | POA: Diagnosis not present

## 2021-09-28 DIAGNOSIS — Z794 Long term (current) use of insulin: Secondary | ICD-10-CM | POA: Diagnosis not present

## 2021-09-28 DIAGNOSIS — H409 Unspecified glaucoma: Secondary | ICD-10-CM | POA: Diagnosis not present

## 2021-09-28 DIAGNOSIS — Z79899 Other long term (current) drug therapy: Secondary | ICD-10-CM | POA: Diagnosis not present

## 2021-09-28 DIAGNOSIS — E1036 Type 1 diabetes mellitus with diabetic cataract: Secondary | ICD-10-CM | POA: Diagnosis not present

## 2021-09-28 DIAGNOSIS — D849 Immunodeficiency, unspecified: Secondary | ICD-10-CM | POA: Diagnosis not present

## 2021-09-28 DIAGNOSIS — D631 Anemia in chronic kidney disease: Secondary | ICD-10-CM | POA: Diagnosis not present

## 2021-09-28 DIAGNOSIS — T86892 Other transplanted tissue infection: Secondary | ICD-10-CM | POA: Diagnosis not present

## 2021-09-28 DIAGNOSIS — R531 Weakness: Secondary | ICD-10-CM | POA: Diagnosis not present

## 2021-09-28 DIAGNOSIS — Z9483 Pancreas transplant status: Secondary | ICD-10-CM | POA: Diagnosis not present

## 2021-09-28 DIAGNOSIS — N186 End stage renal disease: Secondary | ICD-10-CM | POA: Diagnosis not present

## 2021-09-28 DIAGNOSIS — Z20822 Contact with and (suspected) exposure to covid-19: Secondary | ICD-10-CM | POA: Diagnosis not present

## 2021-09-28 DIAGNOSIS — D649 Anemia, unspecified: Secondary | ICD-10-CM | POA: Diagnosis not present

## 2021-09-28 DIAGNOSIS — B259 Cytomegaloviral disease, unspecified: Secondary | ICD-10-CM | POA: Diagnosis not present

## 2021-09-28 DIAGNOSIS — E1022 Type 1 diabetes mellitus with diabetic chronic kidney disease: Secondary | ICD-10-CM | POA: Diagnosis not present

## 2021-09-28 DIAGNOSIS — K8689 Other specified diseases of pancreas: Secondary | ICD-10-CM | POA: Diagnosis not present

## 2021-10-08 DIAGNOSIS — Z4822 Encounter for aftercare following kidney transplant: Secondary | ICD-10-CM | POA: Diagnosis not present

## 2021-10-08 DIAGNOSIS — I951 Orthostatic hypotension: Secondary | ICD-10-CM | POA: Diagnosis not present

## 2021-10-08 DIAGNOSIS — D649 Anemia, unspecified: Secondary | ICD-10-CM | POA: Diagnosis not present

## 2021-10-08 DIAGNOSIS — Z48288 Encounter for aftercare following multiple organ transplant: Secondary | ICD-10-CM | POA: Diagnosis not present

## 2021-10-08 DIAGNOSIS — Z940 Kidney transplant status: Secondary | ICD-10-CM | POA: Diagnosis not present

## 2021-10-08 DIAGNOSIS — Z5181 Encounter for therapeutic drug level monitoring: Secondary | ICD-10-CM | POA: Diagnosis not present

## 2021-10-08 DIAGNOSIS — B259 Cytomegaloviral disease, unspecified: Secondary | ICD-10-CM | POA: Diagnosis not present

## 2021-10-08 DIAGNOSIS — Z79621 Long term (current) use of calcineurin inhibitor: Secondary | ICD-10-CM | POA: Diagnosis not present

## 2021-10-08 DIAGNOSIS — Z79899 Other long term (current) drug therapy: Secondary | ICD-10-CM | POA: Diagnosis not present

## 2021-10-08 DIAGNOSIS — Z7952 Long term (current) use of systemic steroids: Secondary | ICD-10-CM | POA: Diagnosis not present

## 2021-10-08 DIAGNOSIS — Z792 Long term (current) use of antibiotics: Secondary | ICD-10-CM | POA: Diagnosis not present

## 2021-10-08 DIAGNOSIS — Z949 Transplanted organ and tissue status, unspecified: Secondary | ICD-10-CM | POA: Diagnosis not present

## 2021-10-08 DIAGNOSIS — Z9483 Pancreas transplant status: Secondary | ICD-10-CM | POA: Diagnosis not present

## 2021-10-08 DIAGNOSIS — R002 Palpitations: Secondary | ICD-10-CM | POA: Diagnosis not present

## 2021-10-14 DIAGNOSIS — Z9483 Pancreas transplant status: Secondary | ICD-10-CM | POA: Diagnosis not present

## 2021-10-14 DIAGNOSIS — Z940 Kidney transplant status: Secondary | ICD-10-CM | POA: Diagnosis not present

## 2021-10-14 DIAGNOSIS — D72819 Decreased white blood cell count, unspecified: Secondary | ICD-10-CM | POA: Diagnosis not present

## 2021-10-14 DIAGNOSIS — I951 Orthostatic hypotension: Secondary | ICD-10-CM | POA: Diagnosis not present

## 2021-10-14 DIAGNOSIS — D649 Anemia, unspecified: Secondary | ICD-10-CM | POA: Diagnosis not present

## 2021-10-14 DIAGNOSIS — R002 Palpitations: Secondary | ICD-10-CM | POA: Diagnosis not present

## 2021-10-14 DIAGNOSIS — E7849 Other hyperlipidemia: Secondary | ICD-10-CM | POA: Diagnosis not present

## 2021-10-14 DIAGNOSIS — Z48288 Encounter for aftercare following multiple organ transplant: Secondary | ICD-10-CM | POA: Diagnosis not present

## 2021-10-14 DIAGNOSIS — Z79621 Long term (current) use of calcineurin inhibitor: Secondary | ICD-10-CM | POA: Diagnosis not present

## 2021-10-14 DIAGNOSIS — Z5181 Encounter for therapeutic drug level monitoring: Secondary | ICD-10-CM | POA: Diagnosis not present

## 2021-10-14 DIAGNOSIS — E1022 Type 1 diabetes mellitus with diabetic chronic kidney disease: Secondary | ICD-10-CM | POA: Diagnosis not present

## 2021-10-14 DIAGNOSIS — Z4822 Encounter for aftercare following kidney transplant: Secondary | ICD-10-CM | POA: Diagnosis not present

## 2021-10-14 DIAGNOSIS — D849 Immunodeficiency, unspecified: Secondary | ICD-10-CM | POA: Diagnosis not present

## 2021-10-14 DIAGNOSIS — R42 Dizziness and giddiness: Secondary | ICD-10-CM | POA: Diagnosis not present

## 2021-10-14 DIAGNOSIS — R001 Bradycardia, unspecified: Secondary | ICD-10-CM | POA: Diagnosis not present

## 2021-10-14 DIAGNOSIS — D708 Other neutropenia: Secondary | ICD-10-CM | POA: Diagnosis not present

## 2021-10-14 DIAGNOSIS — B259 Cytomegaloviral disease, unspecified: Secondary | ICD-10-CM | POA: Diagnosis not present

## 2021-10-21 DIAGNOSIS — Z940 Kidney transplant status: Secondary | ICD-10-CM | POA: Diagnosis not present

## 2021-10-21 DIAGNOSIS — I951 Orthostatic hypotension: Secondary | ICD-10-CM | POA: Diagnosis not present

## 2021-10-21 DIAGNOSIS — Z79621 Long term (current) use of calcineurin inhibitor: Secondary | ICD-10-CM | POA: Diagnosis not present

## 2021-10-21 DIAGNOSIS — Z79899 Other long term (current) drug therapy: Secondary | ICD-10-CM | POA: Diagnosis not present

## 2021-10-21 DIAGNOSIS — Z4822 Encounter for aftercare following kidney transplant: Secondary | ICD-10-CM | POA: Diagnosis not present

## 2021-10-21 DIAGNOSIS — B259 Cytomegaloviral disease, unspecified: Secondary | ICD-10-CM | POA: Diagnosis not present

## 2021-10-21 DIAGNOSIS — D72819 Decreased white blood cell count, unspecified: Secondary | ICD-10-CM | POA: Diagnosis not present

## 2021-10-21 DIAGNOSIS — E109 Type 1 diabetes mellitus without complications: Secondary | ICD-10-CM | POA: Diagnosis not present

## 2021-10-21 DIAGNOSIS — D849 Immunodeficiency, unspecified: Secondary | ICD-10-CM | POA: Diagnosis not present

## 2021-10-21 DIAGNOSIS — Z5181 Encounter for therapeutic drug level monitoring: Secondary | ICD-10-CM | POA: Diagnosis not present

## 2021-10-21 DIAGNOSIS — D649 Anemia, unspecified: Secondary | ICD-10-CM | POA: Diagnosis not present

## 2021-10-21 DIAGNOSIS — Z9483 Pancreas transplant status: Secondary | ICD-10-CM | POA: Diagnosis not present

## 2021-10-21 DIAGNOSIS — E785 Hyperlipidemia, unspecified: Secondary | ICD-10-CM | POA: Diagnosis not present

## 2021-10-28 DIAGNOSIS — B259 Cytomegaloviral disease, unspecified: Secondary | ICD-10-CM | POA: Diagnosis not present

## 2021-10-28 DIAGNOSIS — E109 Type 1 diabetes mellitus without complications: Secondary | ICD-10-CM | POA: Diagnosis not present

## 2021-10-28 DIAGNOSIS — D649 Anemia, unspecified: Secondary | ICD-10-CM | POA: Diagnosis not present

## 2021-10-28 DIAGNOSIS — D849 Immunodeficiency, unspecified: Secondary | ICD-10-CM | POA: Diagnosis not present

## 2021-10-28 DIAGNOSIS — Z4822 Encounter for aftercare following kidney transplant: Secondary | ICD-10-CM | POA: Diagnosis not present

## 2021-10-28 DIAGNOSIS — R7401 Elevation of levels of liver transaminase levels: Secondary | ICD-10-CM | POA: Diagnosis not present

## 2021-10-28 DIAGNOSIS — E785 Hyperlipidemia, unspecified: Secondary | ICD-10-CM | POA: Diagnosis not present

## 2021-10-28 DIAGNOSIS — I951 Orthostatic hypotension: Secondary | ICD-10-CM | POA: Diagnosis not present

## 2021-11-04 DIAGNOSIS — R002 Palpitations: Secondary | ICD-10-CM | POA: Diagnosis not present

## 2021-11-04 DIAGNOSIS — Z8719 Personal history of other diseases of the digestive system: Secondary | ICD-10-CM | POA: Diagnosis not present

## 2021-11-04 DIAGNOSIS — Z940 Kidney transplant status: Secondary | ICD-10-CM | POA: Diagnosis not present

## 2021-11-04 DIAGNOSIS — Z7969 Long term (current) use of other immunomodulators and immunosuppressants: Secondary | ICD-10-CM | POA: Diagnosis not present

## 2021-11-04 DIAGNOSIS — Z87898 Personal history of other specified conditions: Secondary | ICD-10-CM | POA: Diagnosis not present

## 2021-11-04 DIAGNOSIS — Z4822 Encounter for aftercare following kidney transplant: Secondary | ICD-10-CM | POA: Diagnosis not present

## 2021-11-04 DIAGNOSIS — D849 Immunodeficiency, unspecified: Secondary | ICD-10-CM | POA: Diagnosis not present

## 2021-11-04 DIAGNOSIS — Z9483 Pancreas transplant status: Secondary | ICD-10-CM | POA: Diagnosis not present

## 2021-11-04 DIAGNOSIS — Z79621 Long term (current) use of calcineurin inhibitor: Secondary | ICD-10-CM | POA: Diagnosis not present

## 2021-11-04 DIAGNOSIS — Z48288 Encounter for aftercare following multiple organ transplant: Secondary | ICD-10-CM | POA: Diagnosis not present

## 2021-11-04 DIAGNOSIS — I951 Orthostatic hypotension: Secondary | ICD-10-CM | POA: Diagnosis not present

## 2021-11-04 DIAGNOSIS — D708 Other neutropenia: Secondary | ICD-10-CM | POA: Diagnosis not present

## 2021-11-04 DIAGNOSIS — E785 Hyperlipidemia, unspecified: Secondary | ICD-10-CM | POA: Diagnosis not present

## 2021-11-04 DIAGNOSIS — Z5181 Encounter for therapeutic drug level monitoring: Secondary | ICD-10-CM | POA: Diagnosis not present

## 2021-11-04 DIAGNOSIS — Z7952 Long term (current) use of systemic steroids: Secondary | ICD-10-CM | POA: Diagnosis not present

## 2021-11-04 DIAGNOSIS — B259 Cytomegaloviral disease, unspecified: Secondary | ICD-10-CM | POA: Diagnosis not present

## 2021-11-18 DIAGNOSIS — I951 Orthostatic hypotension: Secondary | ICD-10-CM | POA: Diagnosis not present

## 2021-11-18 DIAGNOSIS — B259 Cytomegaloviral disease, unspecified: Secondary | ICD-10-CM | POA: Diagnosis not present

## 2021-11-18 DIAGNOSIS — Z9483 Pancreas transplant status: Secondary | ICD-10-CM | POA: Diagnosis not present

## 2021-11-18 DIAGNOSIS — Z940 Kidney transplant status: Secondary | ICD-10-CM | POA: Diagnosis not present

## 2021-11-18 DIAGNOSIS — R002 Palpitations: Secondary | ICD-10-CM | POA: Diagnosis not present

## 2021-11-18 DIAGNOSIS — Z48288 Encounter for aftercare following multiple organ transplant: Secondary | ICD-10-CM | POA: Diagnosis not present

## 2021-11-18 DIAGNOSIS — E785 Hyperlipidemia, unspecified: Secondary | ICD-10-CM | POA: Diagnosis not present

## 2021-11-18 DIAGNOSIS — D849 Immunodeficiency, unspecified: Secondary | ICD-10-CM | POA: Diagnosis not present

## 2021-11-18 DIAGNOSIS — D649 Anemia, unspecified: Secondary | ICD-10-CM | POA: Diagnosis not present

## 2021-11-18 DIAGNOSIS — D72819 Decreased white blood cell count, unspecified: Secondary | ICD-10-CM | POA: Diagnosis not present

## 2021-11-18 DIAGNOSIS — Z79899 Other long term (current) drug therapy: Secondary | ICD-10-CM | POA: Diagnosis not present

## 2021-12-02 DIAGNOSIS — E785 Hyperlipidemia, unspecified: Secondary | ICD-10-CM | POA: Diagnosis not present

## 2021-12-02 DIAGNOSIS — I10 Essential (primary) hypertension: Secondary | ICD-10-CM | POA: Diagnosis not present

## 2021-12-02 DIAGNOSIS — Z940 Kidney transplant status: Secondary | ICD-10-CM | POA: Diagnosis not present

## 2021-12-02 DIAGNOSIS — Z4822 Encounter for aftercare following kidney transplant: Secondary | ICD-10-CM | POA: Diagnosis not present

## 2021-12-02 DIAGNOSIS — D849 Immunodeficiency, unspecified: Secondary | ICD-10-CM | POA: Diagnosis not present

## 2021-12-02 DIAGNOSIS — Z5181 Encounter for therapeutic drug level monitoring: Secondary | ICD-10-CM | POA: Diagnosis not present

## 2021-12-02 DIAGNOSIS — E10319 Type 1 diabetes mellitus with unspecified diabetic retinopathy without macular edema: Secondary | ICD-10-CM | POA: Diagnosis not present

## 2021-12-02 DIAGNOSIS — E1042 Type 1 diabetes mellitus with diabetic polyneuropathy: Secondary | ICD-10-CM | POA: Diagnosis not present

## 2021-12-02 DIAGNOSIS — I951 Orthostatic hypotension: Secondary | ICD-10-CM | POA: Diagnosis not present

## 2021-12-02 DIAGNOSIS — D708 Other neutropenia: Secondary | ICD-10-CM | POA: Diagnosis not present

## 2021-12-02 DIAGNOSIS — Z9483 Pancreas transplant status: Secondary | ICD-10-CM | POA: Diagnosis not present

## 2021-12-02 DIAGNOSIS — Z48288 Encounter for aftercare following multiple organ transplant: Secondary | ICD-10-CM | POA: Diagnosis not present

## 2021-12-02 DIAGNOSIS — B259 Cytomegaloviral disease, unspecified: Secondary | ICD-10-CM | POA: Diagnosis not present

## 2021-12-02 DIAGNOSIS — Z949 Transplanted organ and tissue status, unspecified: Secondary | ICD-10-CM | POA: Diagnosis not present

## 2021-12-16 DIAGNOSIS — Z940 Kidney transplant status: Secondary | ICD-10-CM | POA: Diagnosis not present

## 2021-12-16 DIAGNOSIS — I120 Hypertensive chronic kidney disease with stage 5 chronic kidney disease or end stage renal disease: Secondary | ICD-10-CM | POA: Diagnosis not present

## 2021-12-16 DIAGNOSIS — Z9483 Pancreas transplant status: Secondary | ICD-10-CM | POA: Diagnosis not present

## 2021-12-16 DIAGNOSIS — Z48288 Encounter for aftercare following multiple organ transplant: Secondary | ICD-10-CM | POA: Diagnosis not present

## 2021-12-16 DIAGNOSIS — Z79899 Other long term (current) drug therapy: Secondary | ICD-10-CM | POA: Diagnosis not present

## 2021-12-16 DIAGNOSIS — F4310 Post-traumatic stress disorder, unspecified: Secondary | ICD-10-CM | POA: Diagnosis not present

## 2021-12-16 DIAGNOSIS — Z5181 Encounter for therapeutic drug level monitoring: Secondary | ICD-10-CM | POA: Diagnosis not present

## 2021-12-16 DIAGNOSIS — F419 Anxiety disorder, unspecified: Secondary | ICD-10-CM | POA: Diagnosis not present

## 2021-12-30 DIAGNOSIS — F419 Anxiety disorder, unspecified: Secondary | ICD-10-CM | POA: Diagnosis not present

## 2021-12-30 DIAGNOSIS — B259 Cytomegaloviral disease, unspecified: Secondary | ICD-10-CM | POA: Diagnosis not present

## 2021-12-30 DIAGNOSIS — I951 Orthostatic hypotension: Secondary | ICD-10-CM | POA: Diagnosis not present

## 2021-12-30 DIAGNOSIS — E785 Hyperlipidemia, unspecified: Secondary | ICD-10-CM | POA: Diagnosis not present

## 2021-12-30 DIAGNOSIS — Z48288 Encounter for aftercare following multiple organ transplant: Secondary | ICD-10-CM | POA: Diagnosis not present

## 2021-12-30 DIAGNOSIS — E10319 Type 1 diabetes mellitus with unspecified diabetic retinopathy without macular edema: Secondary | ICD-10-CM | POA: Diagnosis not present

## 2021-12-30 DIAGNOSIS — Z5181 Encounter for therapeutic drug level monitoring: Secondary | ICD-10-CM | POA: Diagnosis not present

## 2021-12-30 DIAGNOSIS — D708 Other neutropenia: Secondary | ICD-10-CM | POA: Diagnosis not present

## 2021-12-30 DIAGNOSIS — I10 Essential (primary) hypertension: Secondary | ICD-10-CM | POA: Diagnosis not present

## 2021-12-30 DIAGNOSIS — Z949 Transplanted organ and tissue status, unspecified: Secondary | ICD-10-CM | POA: Diagnosis not present

## 2021-12-30 DIAGNOSIS — Z4822 Encounter for aftercare following kidney transplant: Secondary | ICD-10-CM | POA: Diagnosis not present

## 2021-12-30 DIAGNOSIS — Z9483 Pancreas transplant status: Secondary | ICD-10-CM | POA: Diagnosis not present

## 2021-12-30 DIAGNOSIS — Z7952 Long term (current) use of systemic steroids: Secondary | ICD-10-CM | POA: Diagnosis not present

## 2021-12-30 DIAGNOSIS — D849 Immunodeficiency, unspecified: Secondary | ICD-10-CM | POA: Diagnosis not present

## 2021-12-30 DIAGNOSIS — Z940 Kidney transplant status: Secondary | ICD-10-CM | POA: Diagnosis not present

## 2021-12-30 DIAGNOSIS — Z79621 Long term (current) use of calcineurin inhibitor: Secondary | ICD-10-CM | POA: Diagnosis not present

## 2021-12-30 DIAGNOSIS — Z79624 Long term (current) use of inhibitors of nucleotide synthesis: Secondary | ICD-10-CM | POA: Diagnosis not present

## 2021-12-30 DIAGNOSIS — Z79899 Other long term (current) drug therapy: Secondary | ICD-10-CM | POA: Diagnosis not present

## 2021-12-31 DIAGNOSIS — H401132 Primary open-angle glaucoma, bilateral, moderate stage: Secondary | ICD-10-CM | POA: Diagnosis not present

## 2022-01-11 DIAGNOSIS — F4323 Adjustment disorder with mixed anxiety and depressed mood: Secondary | ICD-10-CM | POA: Diagnosis not present

## 2022-01-11 DIAGNOSIS — Z940 Kidney transplant status: Secondary | ICD-10-CM | POA: Diagnosis not present

## 2022-01-11 DIAGNOSIS — F419 Anxiety disorder, unspecified: Secondary | ICD-10-CM | POA: Diagnosis not present

## 2022-01-11 DIAGNOSIS — F4310 Post-traumatic stress disorder, unspecified: Secondary | ICD-10-CM | POA: Diagnosis not present

## 2022-01-11 DIAGNOSIS — Z9483 Pancreas transplant status: Secondary | ICD-10-CM | POA: Diagnosis not present

## 2022-01-12 DIAGNOSIS — F419 Anxiety disorder, unspecified: Secondary | ICD-10-CM | POA: Diagnosis not present

## 2022-01-13 DIAGNOSIS — Z4822 Encounter for aftercare following kidney transplant: Secondary | ICD-10-CM | POA: Diagnosis not present

## 2022-01-19 DIAGNOSIS — F419 Anxiety disorder, unspecified: Secondary | ICD-10-CM | POA: Diagnosis not present

## 2022-01-21 DIAGNOSIS — Z940 Kidney transplant status: Secondary | ICD-10-CM | POA: Diagnosis not present

## 2022-01-21 DIAGNOSIS — E109 Type 1 diabetes mellitus without complications: Secondary | ICD-10-CM | POA: Diagnosis not present

## 2022-01-21 DIAGNOSIS — E785 Hyperlipidemia, unspecified: Secondary | ICD-10-CM | POA: Diagnosis not present

## 2022-01-21 DIAGNOSIS — Z949 Transplanted organ and tissue status, unspecified: Secondary | ICD-10-CM | POA: Diagnosis not present

## 2022-01-21 DIAGNOSIS — R051 Acute cough: Secondary | ICD-10-CM | POA: Diagnosis not present

## 2022-01-21 DIAGNOSIS — D72819 Decreased white blood cell count, unspecified: Secondary | ICD-10-CM | POA: Diagnosis not present

## 2022-01-21 DIAGNOSIS — B259 Cytomegaloviral disease, unspecified: Secondary | ICD-10-CM | POA: Diagnosis not present

## 2022-01-21 DIAGNOSIS — Z5181 Encounter for therapeutic drug level monitoring: Secondary | ICD-10-CM | POA: Diagnosis not present

## 2022-01-21 DIAGNOSIS — I951 Orthostatic hypotension: Secondary | ICD-10-CM | POA: Diagnosis not present

## 2022-01-21 DIAGNOSIS — Z48288 Encounter for aftercare following multiple organ transplant: Secondary | ICD-10-CM | POA: Diagnosis not present

## 2022-01-21 DIAGNOSIS — Z9483 Pancreas transplant status: Secondary | ICD-10-CM | POA: Diagnosis not present

## 2022-01-21 DIAGNOSIS — R509 Fever, unspecified: Secondary | ICD-10-CM | POA: Diagnosis not present

## 2022-01-21 DIAGNOSIS — D649 Anemia, unspecified: Secondary | ICD-10-CM | POA: Diagnosis not present

## 2022-01-21 DIAGNOSIS — R0989 Other specified symptoms and signs involving the circulatory and respiratory systems: Secondary | ICD-10-CM | POA: Diagnosis not present

## 2022-01-26 DIAGNOSIS — F419 Anxiety disorder, unspecified: Secondary | ICD-10-CM | POA: Diagnosis not present

## 2022-01-27 DIAGNOSIS — F32A Depression, unspecified: Secondary | ICD-10-CM | POA: Diagnosis not present

## 2022-01-27 DIAGNOSIS — I951 Orthostatic hypotension: Secondary | ICD-10-CM | POA: Diagnosis not present

## 2022-01-27 DIAGNOSIS — E869 Volume depletion, unspecified: Secondary | ICD-10-CM | POA: Diagnosis not present

## 2022-01-27 DIAGNOSIS — Z949 Transplanted organ and tissue status, unspecified: Secondary | ICD-10-CM | POA: Diagnosis not present

## 2022-01-27 DIAGNOSIS — D84821 Immunodeficiency due to drugs: Secondary | ICD-10-CM | POA: Diagnosis not present

## 2022-01-27 DIAGNOSIS — D72819 Decreased white blood cell count, unspecified: Secondary | ICD-10-CM | POA: Diagnosis not present

## 2022-01-27 DIAGNOSIS — R5081 Fever presenting with conditions classified elsewhere: Secondary | ICD-10-CM | POA: Diagnosis not present

## 2022-01-27 DIAGNOSIS — Z940 Kidney transplant status: Secondary | ICD-10-CM | POA: Diagnosis not present

## 2022-01-27 DIAGNOSIS — R002 Palpitations: Secondary | ICD-10-CM | POA: Diagnosis not present

## 2022-01-27 DIAGNOSIS — N186 End stage renal disease: Secondary | ICD-10-CM | POA: Diagnosis not present

## 2022-01-27 DIAGNOSIS — N189 Chronic kidney disease, unspecified: Secondary | ICD-10-CM | POA: Diagnosis not present

## 2022-01-27 DIAGNOSIS — B259 Cytomegaloviral disease, unspecified: Secondary | ICD-10-CM | POA: Diagnosis not present

## 2022-01-27 DIAGNOSIS — B9781 Human metapneumovirus as the cause of diseases classified elsewhere: Secondary | ICD-10-CM | POA: Diagnosis not present

## 2022-01-27 DIAGNOSIS — B9789 Other viral agents as the cause of diseases classified elsewhere: Secondary | ICD-10-CM | POA: Diagnosis not present

## 2022-01-27 DIAGNOSIS — Z20822 Contact with and (suspected) exposure to covid-19: Secondary | ICD-10-CM | POA: Diagnosis not present

## 2022-01-27 DIAGNOSIS — E1036 Type 1 diabetes mellitus with diabetic cataract: Secondary | ICD-10-CM | POA: Diagnosis not present

## 2022-01-27 DIAGNOSIS — R918 Other nonspecific abnormal finding of lung field: Secondary | ICD-10-CM | POA: Diagnosis not present

## 2022-01-27 DIAGNOSIS — D709 Neutropenia, unspecified: Secondary | ICD-10-CM | POA: Diagnosis not present

## 2022-01-27 DIAGNOSIS — Z7952 Long term (current) use of systemic steroids: Secondary | ICD-10-CM | POA: Diagnosis not present

## 2022-01-27 DIAGNOSIS — J123 Human metapneumovirus pneumonia: Secondary | ICD-10-CM | POA: Diagnosis not present

## 2022-01-27 DIAGNOSIS — R0600 Dyspnea, unspecified: Secondary | ICD-10-CM | POA: Diagnosis not present

## 2022-01-27 DIAGNOSIS — F419 Anxiety disorder, unspecified: Secondary | ICD-10-CM | POA: Diagnosis not present

## 2022-01-27 DIAGNOSIS — Z9483 Pancreas transplant status: Secondary | ICD-10-CM | POA: Diagnosis not present

## 2022-01-27 DIAGNOSIS — Z79899 Other long term (current) drug therapy: Secondary | ICD-10-CM | POA: Diagnosis not present

## 2022-01-27 DIAGNOSIS — D631 Anemia in chronic kidney disease: Secondary | ICD-10-CM | POA: Diagnosis not present

## 2022-01-27 DIAGNOSIS — D708 Other neutropenia: Secondary | ICD-10-CM | POA: Diagnosis not present

## 2022-01-27 DIAGNOSIS — E785 Hyperlipidemia, unspecified: Secondary | ICD-10-CM | POA: Diagnosis not present

## 2022-01-27 DIAGNOSIS — E1022 Type 1 diabetes mellitus with diabetic chronic kidney disease: Secondary | ICD-10-CM | POA: Diagnosis not present

## 2022-01-31 DIAGNOSIS — Z949 Transplanted organ and tissue status, unspecified: Secondary | ICD-10-CM | POA: Diagnosis not present

## 2022-02-02 DIAGNOSIS — F419 Anxiety disorder, unspecified: Secondary | ICD-10-CM | POA: Diagnosis not present

## 2022-02-04 DIAGNOSIS — Z9483 Pancreas transplant status: Secondary | ICD-10-CM | POA: Diagnosis not present

## 2022-02-04 DIAGNOSIS — D849 Immunodeficiency, unspecified: Secondary | ICD-10-CM | POA: Diagnosis not present

## 2022-02-04 DIAGNOSIS — Z940 Kidney transplant status: Secondary | ICD-10-CM | POA: Diagnosis not present

## 2022-02-09 DIAGNOSIS — F419 Anxiety disorder, unspecified: Secondary | ICD-10-CM | POA: Diagnosis not present

## 2022-02-11 DIAGNOSIS — B259 Cytomegaloviral disease, unspecified: Secondary | ICD-10-CM | POA: Diagnosis not present

## 2022-02-11 DIAGNOSIS — Z4822 Encounter for aftercare following kidney transplant: Secondary | ICD-10-CM | POA: Diagnosis not present

## 2022-02-11 DIAGNOSIS — J123 Human metapneumovirus pneumonia: Secondary | ICD-10-CM | POA: Diagnosis not present

## 2022-02-11 DIAGNOSIS — Z5181 Encounter for therapeutic drug level monitoring: Secondary | ICD-10-CM | POA: Diagnosis not present

## 2022-02-11 DIAGNOSIS — E10319 Type 1 diabetes mellitus with unspecified diabetic retinopathy without macular edema: Secondary | ICD-10-CM | POA: Diagnosis not present

## 2022-02-11 DIAGNOSIS — R002 Palpitations: Secondary | ICD-10-CM | POA: Diagnosis not present

## 2022-02-11 DIAGNOSIS — I10 Essential (primary) hypertension: Secondary | ICD-10-CM | POA: Diagnosis not present

## 2022-02-11 DIAGNOSIS — Z940 Kidney transplant status: Secondary | ICD-10-CM | POA: Diagnosis not present

## 2022-02-11 DIAGNOSIS — E1042 Type 1 diabetes mellitus with diabetic polyneuropathy: Secondary | ICD-10-CM | POA: Diagnosis not present

## 2022-02-11 DIAGNOSIS — D72819 Decreased white blood cell count, unspecified: Secondary | ICD-10-CM | POA: Diagnosis not present

## 2022-02-11 DIAGNOSIS — D849 Immunodeficiency, unspecified: Secondary | ICD-10-CM | POA: Diagnosis not present

## 2022-02-11 DIAGNOSIS — Z48288 Encounter for aftercare following multiple organ transplant: Secondary | ICD-10-CM | POA: Diagnosis not present

## 2022-02-11 DIAGNOSIS — R339 Retention of urine, unspecified: Secondary | ICD-10-CM | POA: Diagnosis not present

## 2022-02-11 DIAGNOSIS — D708 Other neutropenia: Secondary | ICD-10-CM | POA: Diagnosis not present

## 2022-02-11 DIAGNOSIS — I951 Orthostatic hypotension: Secondary | ICD-10-CM | POA: Diagnosis not present

## 2022-02-11 DIAGNOSIS — Z9483 Pancreas transplant status: Secondary | ICD-10-CM | POA: Diagnosis not present

## 2022-02-11 DIAGNOSIS — E785 Hyperlipidemia, unspecified: Secondary | ICD-10-CM | POA: Diagnosis not present

## 2022-02-16 DIAGNOSIS — F419 Anxiety disorder, unspecified: Secondary | ICD-10-CM | POA: Diagnosis not present

## 2022-02-23 DIAGNOSIS — Z9483 Pancreas transplant status: Secondary | ICD-10-CM | POA: Diagnosis not present

## 2022-02-23 DIAGNOSIS — D849 Immunodeficiency, unspecified: Secondary | ICD-10-CM | POA: Diagnosis not present

## 2022-02-23 DIAGNOSIS — Z940 Kidney transplant status: Secondary | ICD-10-CM | POA: Diagnosis not present

## 2022-02-23 DIAGNOSIS — F419 Anxiety disorder, unspecified: Secondary | ICD-10-CM | POA: Diagnosis not present

## 2022-03-03 DIAGNOSIS — I951 Orthostatic hypotension: Secondary | ICD-10-CM | POA: Diagnosis not present

## 2022-03-03 DIAGNOSIS — Z7952 Long term (current) use of systemic steroids: Secondary | ICD-10-CM | POA: Diagnosis not present

## 2022-03-03 DIAGNOSIS — Z9483 Pancreas transplant status: Secondary | ICD-10-CM | POA: Diagnosis not present

## 2022-03-03 DIAGNOSIS — Z79621 Long term (current) use of calcineurin inhibitor: Secondary | ICD-10-CM | POA: Diagnosis not present

## 2022-03-03 DIAGNOSIS — Z79624 Long term (current) use of inhibitors of nucleotide synthesis: Secondary | ICD-10-CM | POA: Diagnosis not present

## 2022-03-03 DIAGNOSIS — Z940 Kidney transplant status: Secondary | ICD-10-CM | POA: Diagnosis not present

## 2022-03-03 DIAGNOSIS — E1042 Type 1 diabetes mellitus with diabetic polyneuropathy: Secondary | ICD-10-CM | POA: Diagnosis not present

## 2022-03-03 DIAGNOSIS — Z48288 Encounter for aftercare following multiple organ transplant: Secondary | ICD-10-CM | POA: Diagnosis not present

## 2022-03-03 DIAGNOSIS — D849 Immunodeficiency, unspecified: Secondary | ICD-10-CM | POA: Diagnosis not present

## 2022-03-03 DIAGNOSIS — B259 Cytomegaloviral disease, unspecified: Secondary | ICD-10-CM | POA: Diagnosis not present

## 2022-03-03 DIAGNOSIS — I10 Essential (primary) hypertension: Secondary | ICD-10-CM | POA: Diagnosis not present

## 2022-03-03 DIAGNOSIS — Z79899 Other long term (current) drug therapy: Secondary | ICD-10-CM | POA: Diagnosis not present

## 2022-03-13 DIAGNOSIS — M79671 Pain in right foot: Secondary | ICD-10-CM | POA: Diagnosis not present

## 2022-03-17 DIAGNOSIS — D849 Immunodeficiency, unspecified: Secondary | ICD-10-CM | POA: Diagnosis not present

## 2022-03-17 DIAGNOSIS — Z9483 Pancreas transplant status: Secondary | ICD-10-CM | POA: Diagnosis not present

## 2022-03-17 DIAGNOSIS — Z940 Kidney transplant status: Secondary | ICD-10-CM | POA: Diagnosis not present

## 2022-03-24 DIAGNOSIS — M79671 Pain in right foot: Secondary | ICD-10-CM | POA: Diagnosis not present

## 2022-03-24 DIAGNOSIS — S92354A Nondisplaced fracture of fifth metatarsal bone, right foot, initial encounter for closed fracture: Secondary | ICD-10-CM | POA: Diagnosis not present

## 2022-03-29 DIAGNOSIS — H608X2 Other otitis externa, left ear: Secondary | ICD-10-CM | POA: Diagnosis not present

## 2022-03-29 DIAGNOSIS — H6123 Impacted cerumen, bilateral: Secondary | ICD-10-CM | POA: Diagnosis not present

## 2022-03-30 DIAGNOSIS — F419 Anxiety disorder, unspecified: Secondary | ICD-10-CM | POA: Diagnosis not present

## 2022-03-31 DIAGNOSIS — Z940 Kidney transplant status: Secondary | ICD-10-CM | POA: Diagnosis not present

## 2022-03-31 DIAGNOSIS — D708 Other neutropenia: Secondary | ICD-10-CM | POA: Diagnosis not present

## 2022-03-31 DIAGNOSIS — D72819 Decreased white blood cell count, unspecified: Secondary | ICD-10-CM | POA: Diagnosis not present

## 2022-03-31 DIAGNOSIS — E785 Hyperlipidemia, unspecified: Secondary | ICD-10-CM | POA: Diagnosis not present

## 2022-03-31 DIAGNOSIS — Z5181 Encounter for therapeutic drug level monitoring: Secondary | ICD-10-CM | POA: Diagnosis not present

## 2022-03-31 DIAGNOSIS — B259 Cytomegaloviral disease, unspecified: Secondary | ICD-10-CM | POA: Diagnosis not present

## 2022-03-31 DIAGNOSIS — Z48288 Encounter for aftercare following multiple organ transplant: Secondary | ICD-10-CM | POA: Diagnosis not present

## 2022-03-31 DIAGNOSIS — Z4822 Encounter for aftercare following kidney transplant: Secondary | ICD-10-CM | POA: Diagnosis not present

## 2022-03-31 DIAGNOSIS — D849 Immunodeficiency, unspecified: Secondary | ICD-10-CM | POA: Diagnosis not present

## 2022-03-31 DIAGNOSIS — I951 Orthostatic hypotension: Secondary | ICD-10-CM | POA: Diagnosis not present

## 2022-03-31 DIAGNOSIS — Z9483 Pancreas transplant status: Secondary | ICD-10-CM | POA: Diagnosis not present

## 2022-03-31 DIAGNOSIS — F419 Anxiety disorder, unspecified: Secondary | ICD-10-CM | POA: Diagnosis not present

## 2022-04-12 DIAGNOSIS — F419 Anxiety disorder, unspecified: Secondary | ICD-10-CM | POA: Diagnosis not present

## 2022-04-12 DIAGNOSIS — F4310 Post-traumatic stress disorder, unspecified: Secondary | ICD-10-CM | POA: Diagnosis not present

## 2022-04-12 DIAGNOSIS — Z9483 Pancreas transplant status: Secondary | ICD-10-CM | POA: Diagnosis not present

## 2022-04-12 DIAGNOSIS — G629 Polyneuropathy, unspecified: Secondary | ICD-10-CM | POA: Diagnosis not present

## 2022-04-12 DIAGNOSIS — D849 Immunodeficiency, unspecified: Secondary | ICD-10-CM | POA: Diagnosis not present

## 2022-04-12 DIAGNOSIS — E1039 Type 1 diabetes mellitus with other diabetic ophthalmic complication: Secondary | ICD-10-CM | POA: Diagnosis not present

## 2022-04-12 DIAGNOSIS — Z940 Kidney transplant status: Secondary | ICD-10-CM | POA: Diagnosis not present

## 2022-04-12 DIAGNOSIS — I739 Peripheral vascular disease, unspecified: Secondary | ICD-10-CM | POA: Diagnosis not present

## 2022-04-12 DIAGNOSIS — I10 Essential (primary) hypertension: Secondary | ICD-10-CM | POA: Diagnosis not present

## 2022-04-12 DIAGNOSIS — I129 Hypertensive chronic kidney disease with stage 1 through stage 4 chronic kidney disease, or unspecified chronic kidney disease: Secondary | ICD-10-CM | POA: Diagnosis not present

## 2022-04-21 DIAGNOSIS — S92354A Nondisplaced fracture of fifth metatarsal bone, right foot, initial encounter for closed fracture: Secondary | ICD-10-CM | POA: Diagnosis not present

## 2022-04-30 DIAGNOSIS — Z940 Kidney transplant status: Secondary | ICD-10-CM | POA: Diagnosis not present

## 2022-04-30 DIAGNOSIS — R112 Nausea with vomiting, unspecified: Secondary | ICD-10-CM | POA: Diagnosis not present

## 2022-04-30 DIAGNOSIS — D708 Other neutropenia: Secondary | ICD-10-CM | POA: Diagnosis not present

## 2022-04-30 DIAGNOSIS — D72819 Decreased white blood cell count, unspecified: Secondary | ICD-10-CM | POA: Diagnosis not present

## 2022-04-30 DIAGNOSIS — D849 Immunodeficiency, unspecified: Secondary | ICD-10-CM | POA: Diagnosis not present

## 2022-04-30 DIAGNOSIS — S92354D Nondisplaced fracture of fifth metatarsal bone, right foot, subsequent encounter for fracture with routine healing: Secondary | ICD-10-CM | POA: Diagnosis not present

## 2022-04-30 DIAGNOSIS — I951 Orthostatic hypotension: Secondary | ICD-10-CM | POA: Diagnosis not present

## 2022-04-30 DIAGNOSIS — Z5181 Encounter for therapeutic drug level monitoring: Secondary | ICD-10-CM | POA: Diagnosis not present

## 2022-04-30 DIAGNOSIS — D84821 Immunodeficiency due to drugs: Secondary | ICD-10-CM | POA: Diagnosis not present

## 2022-04-30 DIAGNOSIS — R5381 Other malaise: Secondary | ICD-10-CM | POA: Diagnosis not present

## 2022-04-30 DIAGNOSIS — R002 Palpitations: Secondary | ICD-10-CM | POA: Diagnosis not present

## 2022-04-30 DIAGNOSIS — Z4822 Encounter for aftercare following kidney transplant: Secondary | ICD-10-CM | POA: Diagnosis not present

## 2022-04-30 DIAGNOSIS — R339 Retention of urine, unspecified: Secondary | ICD-10-CM | POA: Diagnosis not present

## 2022-04-30 DIAGNOSIS — E785 Hyperlipidemia, unspecified: Secondary | ICD-10-CM | POA: Diagnosis not present

## 2022-04-30 DIAGNOSIS — B259 Cytomegaloviral disease, unspecified: Secondary | ICD-10-CM | POA: Diagnosis not present

## 2022-05-04 ENCOUNTER — Ambulatory Visit: Payer: Self-pay

## 2022-05-04 NOTE — Patient Outreach (Signed)
  Care Management   Outreach Note  05/04/2022 Name: PATTON RABINOVICH MRN: [id number] DOB: [birth year]  An unsuccessful telephone outreach was attempted today. The patient was referred to the case management team for assistance with care management and care coordination.   Follow Up Plan:  The care management team will reach out to the patient again over the next 14 days.   Daneen Schick, BSW, CDP Social Worker, Certified Dementia Practitioner Care Coordination [phone]

## 2022-05-07 ENCOUNTER — Ambulatory Visit: Payer: Self-pay

## 2022-05-07 NOTE — Patient Instructions (Signed)
Visit Information  Thank you for taking time to visit with me today. Please don't hesitate to contact me if I can be of assistance to you.   Following are the goals we discussed today:   Goals Addressed             This Visit's Progress    Care Coordination Activities - no follow up required       Care Coordination Interventions: SDoH screening completed - no acute resource needs identified Fall risk assessment completed - patient is a low fall risk but does have limited peripheral vision. Discussed importance of keeping walkways clear to avoid fall hazards Confirmed patient does have an Advance Directive and does not wish to make changes Discussed patient currently has crutches and a shower chair in the home; advised if any other assistive equipment is ever needed to contact his primary care provider Confirmed the patient has access to medications with no concerns regarding cost or adherence Education on care coordination program provided; encouraged the patient to speak with his primary care provider as needed regarding a future referral to the program        Please call the care guide team at [phone] if you need to schedule a future appointment with me.  If you are experiencing a Mental Health or [place] or need someone to talk to, please call [phone]-TALK (toll free, 24 hour hotline)  Patient verbalizes understanding of instructions and care plan provided today and agrees to view in [place]. Active MyChart status and patient understanding of how to access instructions and care plan via MyChart confirmed with patient.     No further follow up required: Please contact your primary care provider as needed.  Daneen Schick, BSW, CDP Social Worker, Certified Dementia Practitioner Care Coordination [phone]

## 2022-05-07 NOTE — Patient Outreach (Signed)
  Care Coordination   Initial Visit Note   05/07/2022 Name: Dillon Lane MRN: [id number] DOB: January 13, 1961  Dillon Lane is a 61 y.o. year old male who sees Burnard Bunting, MD for primary care. I spoke with  [name] by phone today  What matters to the patients health and wellness today?  Doing well at this time, no concerns    Goals Addressed             This Visit's Progress    Care Coordination Activities - no follow up required       Care Coordination Interventions: SDoH screening completed - no acute resource needs identified Fall risk assessment completed - patient is a low fall risk but does have limited peripheral vision. Discussed importance of keeping walkways clear to avoid fall hazards Confirmed patient does have an Advance Directive and does not wish to make changes Discussed patient currently has crutches and a shower chair in the home; advised if any other assistive equipment is ever needed to contact his primary care provider Confirmed the patient has access to medications with no concerns regarding cost or adherence Education on care coordination program provided; encouraged the patient to speak with his primary care provider as needed regarding a future referral to the program        SDOH assessments and interventions completed:  Yes  SDOH Interventions Today    Flowsheet Row Most Recent Value  SDOH Interventions   Food Insecurity Interventions Intervention Not Indicated  Housing Interventions Intervention Not Indicated  Transportation Interventions Intervention Not Indicated        Care Coordination Interventions Activated:  Yes  Care Coordination Interventions:  Yes, provided   Follow up plan: No further intervention required.   Encounter Outcome:  Pt. Visit Completed   Daneen Schick, BSW, CDP Social Worker, Certified Dementia Practitioner Care Coordination [phone]

## 2022-05-19 DIAGNOSIS — S92354A Nondisplaced fracture of fifth metatarsal bone, right foot, initial encounter for closed fracture: Secondary | ICD-10-CM | POA: Diagnosis not present

## 2022-05-26 ENCOUNTER — Encounter (INDEPENDENT_AMBULATORY_CARE_PROVIDER_SITE_OTHER): Payer: Medicare PPO | Admitting: Ophthalmology

## 2022-05-26 DIAGNOSIS — Z940 Kidney transplant status: Secondary | ICD-10-CM | POA: Diagnosis not present

## 2022-05-26 DIAGNOSIS — F419 Anxiety disorder, unspecified: Secondary | ICD-10-CM | POA: Diagnosis not present

## 2022-05-26 DIAGNOSIS — Z5181 Encounter for therapeutic drug level monitoring: Secondary | ICD-10-CM | POA: Diagnosis not present

## 2022-05-26 DIAGNOSIS — G629 Polyneuropathy, unspecified: Secondary | ICD-10-CM | POA: Diagnosis not present

## 2022-05-26 DIAGNOSIS — Z79621 Long term (current) use of calcineurin inhibitor: Secondary | ICD-10-CM | POA: Diagnosis not present

## 2022-05-26 DIAGNOSIS — Z48288 Encounter for aftercare following multiple organ transplant: Secondary | ICD-10-CM | POA: Diagnosis not present

## 2022-05-26 DIAGNOSIS — S92301D Fracture of unspecified metatarsal bone(s), right foot, subsequent encounter for fracture with routine healing: Secondary | ICD-10-CM | POA: Diagnosis not present

## 2022-05-26 DIAGNOSIS — Z9483 Pancreas transplant status: Secondary | ICD-10-CM | POA: Diagnosis not present

## 2022-05-26 DIAGNOSIS — I951 Orthostatic hypotension: Secondary | ICD-10-CM | POA: Diagnosis not present

## 2022-05-26 DIAGNOSIS — B259 Cytomegaloviral disease, unspecified: Secondary | ICD-10-CM | POA: Diagnosis not present

## 2022-05-26 DIAGNOSIS — D849 Immunodeficiency, unspecified: Secondary | ICD-10-CM | POA: Diagnosis not present

## 2022-05-26 DIAGNOSIS — E109 Type 1 diabetes mellitus without complications: Secondary | ICD-10-CM | POA: Diagnosis not present

## 2022-05-26 DIAGNOSIS — E785 Hyperlipidemia, unspecified: Secondary | ICD-10-CM | POA: Diagnosis not present

## 2022-06-02 ENCOUNTER — Encounter (INDEPENDENT_AMBULATORY_CARE_PROVIDER_SITE_OTHER): Payer: Self-pay | Admitting: Ophthalmology

## 2022-06-02 ENCOUNTER — Ambulatory Visit (INDEPENDENT_AMBULATORY_CARE_PROVIDER_SITE_OTHER): Payer: Medicare PPO | Admitting: Ophthalmology

## 2022-06-02 DIAGNOSIS — H4010X0 Unspecified open-angle glaucoma, stage unspecified: Secondary | ICD-10-CM | POA: Diagnosis not present

## 2022-06-02 DIAGNOSIS — H2701 Aphakia, right eye: Secondary | ICD-10-CM

## 2022-06-02 DIAGNOSIS — E103553 Type 1 diabetes mellitus with stable proliferative diabetic retinopathy, bilateral: Secondary | ICD-10-CM

## 2022-06-02 NOTE — Assessment & Plan Note (Signed)
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  Now with potential for pseudophakic rehabilitation using vitrectomy and insertion of Zeiss CT [place], intraocular lens suspended via posterior chamber Yamane scleral tunnel will be discussed with patient and family as an option to avoid ongoing contact lens wear.  While this will maximize visual potential will not allow patient to have reading for Due to the limitations of the macular findings from previous longstAnding diabetic eye disease

## 2022-06-02 NOTE — Assessment & Plan Note (Signed)
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  Now with potential for pseudophakic rehabilitation using vitrectomy and insertion of Zeiss CT [place], intraocular lens suspended via posterior chamber Yamane scleral tunnel will be discussed with patient and family as an option to avoid ongoing contact lens wear.  While this will maximize visual potential will not allow patient to have reading for Due to the limitations of the macular findings from previous longstAnding diabetic eye disease

## 2022-06-02 NOTE — Patient Instructions (Signed)
OD with a aphakia.  I discussed the patient using pseudophakic techniques to perform suspension of a three-piece IOL in the optimal posterior chamber using Yamane scleral tunnel technique.  See Dr. Thermon Leyland YouTube channel for using Margaree Mackintosh scleral tunnel technique for suspension of Zeiss CT [place] the ocular lens

## 2022-06-02 NOTE — Progress Notes (Signed)
06/02/2022     CHIEF COMPLAINT Patient presents for  Chief Complaint  Patient presents with   Retina Follow Up      HISTORY OF PRESENT ILLNESS: Dillon Lane is a 61 y.o. male who presents to the clinic today for:   HPI     Retina Follow Up           Diagnosis: Other   Laterality: both eyes   Severity: moderate   Course: stable         Comments   1 YR FU OU OCT FP. Pt stated vision has remained stable since last visit. Pt denies floaters and FOL. Pt is currently taking timolol 1 drop into both eyes in the morning Pt is only taking Lumigan 1 drop into both eyes during bedtime. 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 Pt had a generic of Lumigan and the past three nights of the generic form. Pt wanted to know if that was okay.          Last edited by Silvestre Moment on 06/02/2022 10:37 AM.      Referring physician: Katy Apo, MD [place],  [place] [id number]  HISTORICAL INFORMATION:   Selected notes from the MEDICAL RECORD NUMBER       CURRENT MEDICATIONS: Current Outpatient Medications (Ophthalmic Drugs)  Medication Sig   bimatoprost (LUMIGAN) 0.01 % SOLN Place 1 drop into both eyes at bedtime.   Carboxymethylcellul-Glycerin (LUBRICATING EYE DROPS OP) Place 1 drop into the right eye daily as needed (dry eyes).   timolol (TIMOPTIC) 0.5 % ophthalmic solution Place 1 drop into both eyes daily.    Timolol Maleate PF 0.5 % SOLN Place 1 drop into both eyes daily.   No current facility-administered medications for this visit. (Ophthalmic Drugs)   Current Outpatient Medications (Other)  Medication Sig   AMLODIPINE BENZOATE PO Take 5 mg by mouth every evening.   aspirin 81 MG tablet Take 81 mg by mouth daily.   cetirizine (ZYRTEC) 10 MG tablet Take 10 mg by mouth daily as needed for allergies.    Cholecalciferol (VITAMIN D) 50 MCG (2000 UT) tablet Take 2,000 Units by mouth daily.   Colloidal Oatmeal (GOLD BOND ECZEMA RELIEF EX) Apply 1  application topically daily as needed (eczema).   Emollient (UDDERLY SMOOTH) CREA Apply 1 application topically daily as needed (itching).   furosemide (LASIX) 40 MG tablet Take 40 mg by mouth daily.   HUMALOG 100 UNIT/ML injection Inject 45-50 Units into the skin daily. Uses in insulin pump averages about 45-50 units per day   hydrALAZINE (APRESOLINE) 50 MG tablet Take 50 mg by mouth 3 (three) times daily.    HYDROcodone-acetaminophen (NORCO) 5-325 MG tablet Take 1 tablet by mouth every 6 (six) hours as needed. (Patient not taking: Reported on 02/25/2020)   Lactase (LACTAID PO) Take 2 tablets by mouth daily as needed (consuming milk).    rosuvastatin (CRESTOR) 20 MG tablet Take 20 mg by mouth every Monday.    No current facility-administered medications for this visit. (Other)      REVIEW OF SYSTEMS: ROS   Negative for: Constitutional, Gastrointestinal, Neurological, Skin, Genitourinary, Musculoskeletal, HENT, Endocrine, Cardiovascular, Eyes, Respiratory, Psychiatric, Allergic/Imm, Heme/Lymph Last edited by Silvestre Moment on 06/02/2022 10:33 AM.       ALLERGIES Allergies  Allergen Reactions   Tetracycline Shortness Of Breath    "Swelling"   Tetracyclines & Related Shortness Of Breath and Swelling   Milk-Related Compounds  Diarrhea    Bloating, cramping   Nsaids     Due to poor kidney function    PAST MEDICAL HISTORY Past Medical History:  Diagnosis Date   Allergy    fall   Cancer [facility])    pt states he has never been diagnosed    Chronic kidney disease    stage 4   Depression    05-2016- no meds    Diabetes mellitus without complication (HCC)    DM type 1, on insulin pump.   Glaucoma    Glaucoma    Hyperlipidemia    Hypertension    OSA on CPAP    Osteoporosis    PONV (postoperative nausea and vomiting)    1 time got nauseated, then passed out (one of the eye surgeries)   Retinopathy    20/200 in right eye   Sleep apnea    wears cpap   Syncope and collapse    states he  can cough and pass out   Past Surgical History:  Procedure Laterality Date   AV FISTULA PLACEMENT Right 11/08/2019   Procedure: Creation of RIGHT ARM Radiocephalic ARTERIOVENOUS (AV) FISTULA;  Surgeon: Waynetta Sandy, MD;  Location: [facility];  Service: Vascular;  Laterality: Right;   CATARACT EXTRACTION  1994   left eye   COLONOSCOPY     DG BARIUM SWALLOW ([place] HX)  1994   EYE SURGERY     x3 with 9 sessions of lasers for retinopathy.   glaucoma surgery     Dr Maylene Roes   HAND SURGERY  2 years ago   x2   POLYPECTOMY     VITRECTOMY     x3    FAMILY HISTORY Family History  Problem Relation Age of Onset   Brain cancer Mother    Prostate cancer Father    Colon cancer Neg Hx    Colon polyps Neg Hx    Rectal cancer Neg Hx    Stomach cancer Neg Hx    Esophageal cancer Neg Hx     SOCIAL HISTORY Social History   Tobacco Use   Smoking status: Never   Smokeless tobacco: Never  Vaping Use   Vaping Use: Never used  Substance Use Topics   Alcohol use: No   Drug use: No         OPHTHALMIC EXAM:  Base Eye Exam     Visual Acuity (ETDRS)       Right Left   Dist cc 20/400 20/25   Dist ph cc 20/200 -1     Correction: Glasses  Pt normally wears contacts in OD and left contacts at home.         Tonometry (Tonopen, 10:44 AM)       Right Left   Pressure 17 15         Pupils       Pupils Dark Light Shape React APD   Right PERRL 2 2 Irregular Minimal None   Left PERRL 2 2 Irregular Minimal None         Visual Fields       Left Right   Restrictions Partial outer superior temporal, inferior temporal, superior nasal, inferior nasal deficiencies Partial outer superior temporal, inferior temporal, superior nasal, inferior nasal deficiencies         Extraocular Movement       Right Left    Full, Ortho Full, Ortho         Neuro/Psych     Oriented x3: Yes  Mood/Affect: Normal         Dilation     Both eyes: 1.0% Mydriacyl, 2.5% Phenylephrine  @ 10:44 AM           Slit Lamp and Fundus Exam     External Exam       Right Left   External Normal Normal         Slit Lamp Exam       Right Left   Lids/Lashes Normal Normal   Conjunctiva/Sclera White and quiet White and quiet   Cornea Clear Clear   Anterior Chamber Deep and quiet Deep and quiet   Iris Irregular pupil inferior PI Round and reactive   Lens Aphakia Posterior chamber intraocular lens   Anterior Vitreous Normal Normal         Fundus Exam       Right Left   Posterior Vitreous Vitrectomized Vitrectomized   Disc Pallor 1+ Pallor 1+   C/D Ratio 0.55 0.55   Macula Geographic atrophy small central region in the fovea, residual from prior resolved CSME decades previous Geographic atrophy small central region in the fovea, residual from prior resolved CSME decades previous   Vessels PDR-quiet PDR-quiet   Periphery Good PRP peripherally, with a "lesion creep" expansion of laser chorioretinal scars Good PRP peripherally, with a "lesion creep" expansion of laser chorioretinal scars            IMAGING AND PROCEDURES  Imaging and Procedures for 06/02/22  OCT, Retina - OU - Both Eyes       Right Eye Quality was good. Scan locations included subfoveal. Central Foveal Thickness: 304. Progression has been stable. Findings include abnormal foveal contour.   Left Eye Quality was good. Scan locations included subfoveal. Central Foveal Thickness: 306. Progression has been stable. Findings include abnormal foveal contour.      Color Fundus Photography Optos - OU - Both Eyes       Right Eye Progression has been stable. Disc findings include increased cup to disc ratio. Macula : normal observations.   Left Eye Progression has been stable. Disc findings include increased cup to disc ratio. Macula : normal observations.   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  Now with potential for pseudophakic rehabilitation using vitrectomy and insertion of Zeiss CT [place], intraocular lens suspended via posterior chamber Yamane scleral tunnel will be discussed with patient and family as an option to avoid ongoing contact lens wear.  While this will maximize visual potential will not allow patient to have reading for Due to the limitations of the macular findings from previous longstAnding diabetic eye disease  Aphakia of eye, right OD, aphakia longstanding due to a complex retinal detachment repair over 25 years previous.  Now with potential for pseudophakic rehabilitation using vitrectomy and insertion of Zeiss CT [place], intraocular lens suspended via posterior chamber Yamane scleral tunnel will be discussed with patient and family as an option to avoid ongoing contact lens wear.  While this will maximize visual potential will not allow patient to have reading for Due to the limitations of the macular findings from previous longstAnding diabetic eye disease     ICD-10-CM   1. 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 Aphakic open-angle glaucoma of right eye  H40.10X0    H27.01     3. Aphakia of eye, right  H27.01       1.  OU with quiescent PDR now stable for over 25 years.  2.  Lesion creep that is enlargement of the previous PRP is now confluent but this is not the way it was delivered.  This is a classic finding as talk to me and displayed to me by the [place] in my training  3.  OD with a aphakia.  I discussed the patient using pseudophakic techniques to perform suspension of a three-piece IOL in the optimal posterior chamber using Yamane scleral tunnel technique.  Ophthalmic Meds Ordered this visit:  No  orders of the defined types were placed in this encounter.      Return in about 1 year (around 06/03/2023) for DILATE OU, COLOR FP, OCT.  Patient Instructions   OD with a aphakia.  I discussed the patient using pseudophakic techniques to perform suspension of a three-piece IOL in the optimal posterior chamber using Yamane scleral tunnel technique.  See Dr. Thermon Leyland YouTube channel for using Margaree Mackintosh scleral tunnel technique for suspension of Zeiss CT [place] the ocular lens   Explained the diagnoses, plan, and follow up with the patient and they expressed understanding.  Patient expressed understanding of the importance of proper follow up care.   Clent Demark Gregori Abril M.D. Diseases & Surgery of the Retina and Vitreous Retina & Diabetic [place] 06/02/22     Abbreviations: M myopia (nearsighted); A astigmatism; H hyperopia (farsighted); P presbyopia; Mrx spectacle prescription;  CTL contact lenses; OD right eye; OS left eye; OU both eyes  XT exotropia; ET esotropia; PEK punctate epithelial keratitis; PEE punctate epithelial erosions; DES dry eye syndrome; MGD meibomian gland dysfunction; ATs artificial tears; PFAT's preservative free artificial tears; [place] nuclear sclerotic cataract; PSC posterior subcapsular cataract; ERM epi-retinal membrane; PVD posterior vitreous detachment; RD retinal detachment; DM diabetes mellitus; DR diabetic retinopathy; NPDR non-proliferative diabetic retinopathy; PDR proliferative diabetic retinopathy; CSME clinically significant macular edema; DME diabetic macular edema; dbh dot blot hemorrhages; CWS cotton wool spot; POAG primary open angle glaucoma; C/D cup-to-disc ratio; HVF humphrey visual field; GVF goldmann visual field; OCT optical coherence tomography; IOP intraocular pressure; BRVO Branch retinal vein occlusion; CRVO central retinal vein occlusion; CRAO central retinal artery occlusion; BRAO branch retinal artery occlusion; RT retinal tear; SB scleral buckle; PPV  pars plana vitrectomy; VH Vitreous hemorrhage; PRP panretinal laser photocoagulation; IVK intravitreal kenalog; VMT vitreomacular traction; MH Macular hole;  NVD neovascularization of the disc; NVE neovascularization elsewhere; AREDS age related eye disease study; ARMD age related macular degeneration; POAG primary open angle glaucoma; EBMD epithelial/anterior basement membrane dystrophy; ACIOL anterior chamber intraocular lens; IOL intraocular lens; PCIOL posterior chamber intraocular lens; Phaco/IOL phacoemulsification with intraocular lens placement; [place] photorefractive keratectomy; LASIK laser assisted in situ keratomileusis; HTN hypertension; DM diabetes mellitus; COPD chronic obstructive pulmonary disease

## 2022-06-23 DIAGNOSIS — E785 Hyperlipidemia, unspecified: Secondary | ICD-10-CM | POA: Diagnosis not present

## 2022-06-23 DIAGNOSIS — I951 Orthostatic hypotension: Secondary | ICD-10-CM | POA: Diagnosis not present

## 2022-06-23 DIAGNOSIS — K8592 Acute pancreatitis with infected necrosis, unspecified: Secondary | ICD-10-CM | POA: Diagnosis not present

## 2022-06-23 DIAGNOSIS — D72819 Decreased white blood cell count, unspecified: Secondary | ICD-10-CM | POA: Diagnosis not present

## 2022-06-23 DIAGNOSIS — Z48288 Encounter for aftercare following multiple organ transplant: Secondary | ICD-10-CM | POA: Diagnosis not present

## 2022-06-23 DIAGNOSIS — E10638 Type 1 diabetes mellitus with other oral complications: Secondary | ICD-10-CM | POA: Diagnosis not present

## 2022-06-23 DIAGNOSIS — F419 Anxiety disorder, unspecified: Secondary | ICD-10-CM | POA: Diagnosis not present

## 2022-07-08 DIAGNOSIS — H401131 Primary open-angle glaucoma, bilateral, mild stage: Secondary | ICD-10-CM | POA: Diagnosis not present

## 2022-07-08 DIAGNOSIS — Z4822 Encounter for aftercare following kidney transplant: Secondary | ICD-10-CM | POA: Diagnosis not present

## 2022-07-14 DIAGNOSIS — Z9483 Pancreas transplant status: Secondary | ICD-10-CM | POA: Diagnosis not present

## 2022-07-14 DIAGNOSIS — N2889 Other specified disorders of kidney and ureter: Secondary | ICD-10-CM | POA: Diagnosis not present

## 2022-07-14 DIAGNOSIS — R944 Abnormal results of kidney function studies: Secondary | ICD-10-CM | POA: Diagnosis not present

## 2022-07-14 DIAGNOSIS — Z940 Kidney transplant status: Secondary | ICD-10-CM | POA: Diagnosis not present

## 2022-07-21 DIAGNOSIS — Z48288 Encounter for aftercare following multiple organ transplant: Secondary | ICD-10-CM | POA: Diagnosis not present

## 2022-07-21 DIAGNOSIS — Z79621 Long term (current) use of calcineurin inhibitor: Secondary | ICD-10-CM | POA: Diagnosis not present

## 2022-07-21 DIAGNOSIS — Z940 Kidney transplant status: Secondary | ICD-10-CM | POA: Diagnosis not present

## 2022-07-21 DIAGNOSIS — Z5181 Encounter for therapeutic drug level monitoring: Secondary | ICD-10-CM | POA: Diagnosis not present

## 2022-07-21 DIAGNOSIS — Z7952 Long term (current) use of systemic steroids: Secondary | ICD-10-CM | POA: Diagnosis not present

## 2022-07-21 DIAGNOSIS — Z9483 Pancreas transplant status: Secondary | ICD-10-CM | POA: Diagnosis not present

## 2022-07-21 DIAGNOSIS — Z79624 Long term (current) use of inhibitors of nucleotide synthesis: Secondary | ICD-10-CM | POA: Diagnosis not present

## 2022-07-21 DIAGNOSIS — R002 Palpitations: Secondary | ICD-10-CM | POA: Diagnosis not present

## 2022-07-21 DIAGNOSIS — Z792 Long term (current) use of antibiotics: Secondary | ICD-10-CM | POA: Diagnosis not present

## 2022-07-21 DIAGNOSIS — D849 Immunodeficiency, unspecified: Secondary | ICD-10-CM | POA: Diagnosis not present

## 2022-07-21 DIAGNOSIS — I951 Orthostatic hypotension: Secondary | ICD-10-CM | POA: Diagnosis not present

## 2022-07-24 DIAGNOSIS — Z940 Kidney transplant status: Secondary | ICD-10-CM | POA: Diagnosis not present

## 2022-07-24 DIAGNOSIS — Z9483 Pancreas transplant status: Secondary | ICD-10-CM | POA: Diagnosis not present

## 2022-07-24 DIAGNOSIS — Z23 Encounter for immunization: Secondary | ICD-10-CM | POA: Diagnosis not present

## 2022-07-24 DIAGNOSIS — E1029 Type 1 diabetes mellitus with other diabetic kidney complication: Secondary | ICD-10-CM | POA: Diagnosis not present

## 2022-07-24 DIAGNOSIS — I129 Hypertensive chronic kidney disease with stage 1 through stage 4 chronic kidney disease, or unspecified chronic kidney disease: Secondary | ICD-10-CM | POA: Diagnosis not present

## 2022-07-26 ENCOUNTER — Encounter: Payer: Self-pay | Admitting: *Deleted

## 2022-07-26 NOTE — Progress Notes (Signed)
PATIENT: Dillon Lane DOB: [birth year]  REASON FOR VISIT: follow up HISTORY FROM: patient  Virtual Visit via Video Note  I connected with Dillon Lane on 07/27/22 at  1:30 PM EDT by a video enabled telemedicine application located remotely at [facility] and verified that I am speaking with the correct person using two identifiers who was located at their own home.   I discussed the limitations of evaluation and management by telemedicine and the availability of in person appointments. The patient expressed understanding and agreed to proceed.   PATIENT: Dillon Lane DOB: 02-Feb-1961  REASON FOR VISIT: follow up HISTORY FROM: patient  HISTORY OF PRESENT ILLNESS: Today 07/27/22:  Dillon Lane is a 61 year male with a history of OSA on CPAP. He returns today for follow-up. Reports CPAP working well. Some nights be doesn't sleep the entire night. Other nights he takes the mask off. His DL is below.     07/28/21: Dillon Lane is a 61 year old male with a history of OSA on CPAP. Donwload is below. Reports that he is trying ot go to bed earlier. 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 Has recently been hospitalized due to infection related to transplant. On IV ABX    07/23/20: Dillon Lane is a 61 year old male with a history of OSA on CPAP. He returns today for follow-up. Download indicates that he used machine 24/30 days for compliance of 80%. Used machine >4 hours 21/30 days for compliance of 70%. Residual AHI 1.5 on 14cm H20 with EPR 3. Leak in the 95th percentile is 31.5 %. Overall doing well with CPAP. On transplant list for pancreas and kidney. Itching at night which keeps him awake some nights- seeing dermatology.   HISTORY 07/24/19:   Dillon Lane is a 61 year old male with a history of obstructive sleep apnea on CPAP.  His download indicates that he uses machine 24 out of 30 days for compliance of 80%.  He uses machine greater than 4 hours 19 days for  compliance of 63%.  On average he uses his machine 5 hours and 50 minutes.  His residual AHI is 1.7 on 14 cm of water with EPR of 3.  His leak in the 95th percentile is 25.2 L/min.  He currently has the nasal pillows.  He does not feel his mask leaking at night.  He does not have a set bedtime routine.  He reports that he has been diagnosed with stage IV kidney disease.  [name]  He returns today for an evaluation.  REVIEW OF SYSTEMS: Out of a complete 14 system review of symptoms, the patient complains only of the following symptoms, and all other reviewed systems are negative.  See HPI  ALLERGIES: Allergies  Allergen Reactions   Tetracycline Shortness Of Breath    "Swelling"   Tetracyclines & Related Shortness Of Breath and Swelling   Milk-Related Compounds Diarrhea    Bloating, cramping   Nsaids     Due to poor kidney function    HOME MEDICATIONS: Outpatient Medications Prior to Visit  Medication Sig Dispense Refill   AMLODIPINE BENZOATE PO Take 5 mg by mouth every evening.     aspirin 81 MG tablet Take 81 mg by mouth daily.     bimatoprost (LUMIGAN) 0.01 % SOLN Place 1 drop into both eyes at bedtime.     Carboxymethylcellul-Glycerin (LUBRICATING EYE DROPS OP) Place 1 drop into the right eye daily as needed (dry eyes).  cetirizine (ZYRTEC) 10 MG tablet Take 10 mg by mouth daily as needed for allergies.      Cholecalciferol (VITAMIN D) 50 MCG (2000 UT) tablet Take 2,000 Units by mouth daily.     Colloidal Oatmeal (GOLD BOND ECZEMA RELIEF EX) Apply 1 application topically daily as needed (eczema).     Emollient (UDDERLY SMOOTH) CREA Apply 1 application topically daily as needed (itching).     furosemide (LASIX) 40 MG tablet Take 40 mg by mouth daily.     HUMALOG 100 UNIT/ML injection Inject 45-50 Units into the skin daily. Uses in insulin pump averages about 45-50 units per day  12   hydrALAZINE (APRESOLINE) 50 MG tablet Take 50 mg by mouth 3 (three) times daily.       HYDROcodone-acetaminophen (NORCO) 5-325 MG tablet Take 1 tablet by mouth every 6 (six) hours as needed. (Patient not taking: Reported on 02/25/2020) 8 tablet 0   Lactase (LACTAID PO) Take 2 tablets by mouth daily as needed (consuming milk).      rosuvastatin (CRESTOR) 20 MG tablet Take 20 mg by mouth every Monday.      timolol (TIMOPTIC) 0.5 % ophthalmic solution Place 1 drop into both eyes daily.      Timolol Maleate PF 0.5 % SOLN Place 1 drop into both eyes daily.     No facility-administered medications prior to visit.    PAST MEDICAL HISTORY: Past Medical History:  Diagnosis Date   Allergy    fall   Cancer [facility])    pt states he has never been diagnosed    Chronic kidney disease    stage 4   Depression    05-2016- no meds    Diabetes mellitus without complication (HCC)    DM type 1, on insulin pump.   Glaucoma    Glaucoma    Hyperlipidemia    Hypertension    OSA on CPAP    Osteoporosis    PONV (postoperative nausea and vomiting)    1 time got nauseated, then passed out (one of the eye surgeries)   Retinopathy    20/200 in right eye   Sleep apnea    wears cpap   Syncope and collapse    states he can cough and pass out    PAST SURGICAL HISTORY: Past Surgical History:  Procedure Laterality Date   AV FISTULA PLACEMENT Right 11/08/2019   Procedure: Creation of RIGHT ARM Radiocephalic ARTERIOVENOUS (AV) FISTULA;  Surgeon: Waynetta Sandy, MD;  Location: [facility];  Service: Vascular;  Laterality: Right;   CATARACT EXTRACTION  1994   left eye   COLONOSCOPY     DG BARIUM SWALLOW ([place] HX)  1994   EYE SURGERY     x3 with 9 sessions of lasers for retinopathy.   glaucoma surgery     Dr Maylene Roes   HAND SURGERY  2 years ago   x2   POLYPECTOMY     VITRECTOMY     x3    FAMILY HISTORY: Family History  Problem Relation Age of Onset   Brain cancer Mother    Prostate cancer Father    Colon cancer Neg Hx    Colon polyps Neg Hx    Rectal cancer Neg Hx    Stomach cancer  Neg Hx    Esophageal cancer Neg Hx     SOCIAL HISTORY: Social History   Socioeconomic History   Marital status: Married    Spouse name: Not on file   Number of children: 2  Years of education: HS   Highest education level: Not on file  Occupational History   Occupation: Disability   Tobacco Use   Smoking status: Never   Smokeless tobacco: Never  Vaping Use   Vaping Use: Never used  Substance and Sexual Activity   Alcohol use: No   Drug use: No   Sexual activity: Not on file  Other Topics Concern   Not on file  Social History Narrative   Denies caffeine use    Social Determinants of Health   Financial Resource Strain: Not on file  Food Insecurity: No Food Insecurity (05/07/2022)   Hunger Vital Sign    Worried About Running Out of Food in the Last Year: Never true    Ran Out of Food in the Last Year: Never true  Transportation Needs: No Transportation Needs (05/07/2022)   PRAPARE - Hydrologist (Medical): No    Lack of Transportation (Non-Medical): No  Physical Activity: Not on file  Stress: Not on file  Social Connections: Not on file  Intimate Partner Violence: Not on file      PHYSICAL EXAM Generalized: Well developed, in no acute distress   Neurological examination  Mentation: Alert oriented to time, place, history taking. Follows all commands speech and language fluent Cranial nerve II-XII:Extraocular movements were full. Facial symmetry noted. uvula tongue midline. Head turning and shoulder shrug  were normal and symmetric.  Reflexes: UTA  DIAGNOSTIC DATA (LABS, IMAGING, TESTING) - I reviewed patient records, labs, notes, testing and imaging myself where available.  Lab Results  Component Value Date   HGB 12.9 (L) 11/08/2019   HCT 38.0 (L) 11/08/2019      Component Value Date/Time   NA 131 (L) 11/08/2019 0933   K 4.2 11/08/2019 0933   CL 95 (L) 11/08/2019 0933   GLUCOSE 167 (H) 11/08/2019 0933   BUN 54 (H) 11/08/2019  0933   CREATININE 4.90 (H) 11/08/2019 0933      ASSESSMENT AND PLAN 61 y.o. year old male  has a past medical history of Allergy, Cancer ([place]), Chronic kidney disease, Depression, Diabetes mellitus without complication ([place]), Glaucoma, Glaucoma, Hyperlipidemia, Hypertension, OSA on CPAP, Osteoporosis, PONV (postoperative nausea and vomiting), Retinopathy, Sleep apnea, and Syncope and collapse. here with:  OSA on CPAP  CPAP compliance excellent Residual AHI is good Encouraged patient to continue using CPAP nightly and > 4 hours each night F/U in 1 year or sooner if needed   Ward Givens, MSN, NP-C 07/27/2022, 1:40 PM [facility] [address], [place], [place] [address] [phone]

## 2022-07-27 ENCOUNTER — Telehealth (INDEPENDENT_AMBULATORY_CARE_PROVIDER_SITE_OTHER): Payer: Medicare PPO | Admitting: Adult Health

## 2022-07-27 DIAGNOSIS — Z9989 Dependence on other enabling machines and devices: Secondary | ICD-10-CM

## 2022-07-27 DIAGNOSIS — G4733 Obstructive sleep apnea (adult) (pediatric): Secondary | ICD-10-CM | POA: Diagnosis not present

## 2022-07-28 DIAGNOSIS — Z79621 Long term (current) use of calcineurin inhibitor: Secondary | ICD-10-CM | POA: Diagnosis not present

## 2022-07-28 DIAGNOSIS — Z48288 Encounter for aftercare following multiple organ transplant: Secondary | ICD-10-CM | POA: Diagnosis not present

## 2022-07-28 DIAGNOSIS — Z5181 Encounter for therapeutic drug level monitoring: Secondary | ICD-10-CM | POA: Diagnosis not present

## 2022-07-28 DIAGNOSIS — R7989 Other specified abnormal findings of blood chemistry: Secondary | ICD-10-CM | POA: Diagnosis not present

## 2022-08-04 DIAGNOSIS — Z48288 Encounter for aftercare following multiple organ transplant: Secondary | ICD-10-CM | POA: Diagnosis not present

## 2022-08-04 DIAGNOSIS — Z79621 Long term (current) use of calcineurin inhibitor: Secondary | ICD-10-CM | POA: Diagnosis not present

## 2022-08-04 DIAGNOSIS — R7989 Other specified abnormal findings of blood chemistry: Secondary | ICD-10-CM | POA: Diagnosis not present

## 2022-08-04 DIAGNOSIS — Z5181 Encounter for therapeutic drug level monitoring: Secondary | ICD-10-CM | POA: Diagnosis not present

## 2022-08-11 DIAGNOSIS — L814 Other melanin hyperpigmentation: Secondary | ICD-10-CM | POA: Diagnosis not present

## 2022-08-11 DIAGNOSIS — D225 Melanocytic nevi of trunk: Secondary | ICD-10-CM | POA: Diagnosis not present

## 2022-08-11 DIAGNOSIS — L821 Other seborrheic keratosis: Secondary | ICD-10-CM | POA: Diagnosis not present

## 2022-08-11 DIAGNOSIS — L853 Xerosis cutis: Secondary | ICD-10-CM | POA: Diagnosis not present

## 2022-08-25 DIAGNOSIS — Z9483 Pancreas transplant status: Secondary | ICD-10-CM | POA: Diagnosis not present

## 2022-08-25 DIAGNOSIS — I120 Hypertensive chronic kidney disease with stage 5 chronic kidney disease or end stage renal disease: Secondary | ICD-10-CM | POA: Diagnosis not present

## 2022-08-25 DIAGNOSIS — Z940 Kidney transplant status: Secondary | ICD-10-CM | POA: Diagnosis not present

## 2022-08-25 DIAGNOSIS — E1029 Type 1 diabetes mellitus with other diabetic kidney complication: Secondary | ICD-10-CM | POA: Diagnosis not present

## 2022-09-01 DIAGNOSIS — Z5181 Encounter for therapeutic drug level monitoring: Secondary | ICD-10-CM | POA: Diagnosis not present

## 2022-09-01 DIAGNOSIS — B259 Cytomegaloviral disease, unspecified: Secondary | ICD-10-CM | POA: Diagnosis not present

## 2022-09-01 DIAGNOSIS — D849 Immunodeficiency, unspecified: Secondary | ICD-10-CM | POA: Diagnosis not present

## 2022-09-01 DIAGNOSIS — Z940 Kidney transplant status: Secondary | ICD-10-CM | POA: Diagnosis not present

## 2022-09-01 DIAGNOSIS — D72819 Decreased white blood cell count, unspecified: Secondary | ICD-10-CM | POA: Diagnosis not present

## 2022-09-01 DIAGNOSIS — E785 Hyperlipidemia, unspecified: Secondary | ICD-10-CM | POA: Diagnosis not present

## 2022-09-01 DIAGNOSIS — Z8719 Personal history of other diseases of the digestive system: Secondary | ICD-10-CM | POA: Diagnosis not present

## 2022-09-01 DIAGNOSIS — I10 Essential (primary) hypertension: Secondary | ICD-10-CM | POA: Diagnosis not present

## 2022-09-01 DIAGNOSIS — Z79621 Long term (current) use of calcineurin inhibitor: Secondary | ICD-10-CM | POA: Diagnosis not present

## 2022-09-01 DIAGNOSIS — Z48288 Encounter for aftercare following multiple organ transplant: Secondary | ICD-10-CM | POA: Diagnosis not present

## 2022-09-01 DIAGNOSIS — G5603 Carpal tunnel syndrome, bilateral upper limbs: Secondary | ICD-10-CM | POA: Diagnosis not present

## 2022-09-01 DIAGNOSIS — R002 Palpitations: Secondary | ICD-10-CM | POA: Diagnosis not present

## 2022-09-01 DIAGNOSIS — Z9483 Pancreas transplant status: Secondary | ICD-10-CM | POA: Diagnosis not present

## 2022-09-01 DIAGNOSIS — Z4822 Encounter for aftercare following kidney transplant: Secondary | ICD-10-CM | POA: Diagnosis not present

## 2022-09-01 DIAGNOSIS — I951 Orthostatic hypotension: Secondary | ICD-10-CM | POA: Diagnosis not present

## 2022-09-01 DIAGNOSIS — Z792 Long term (current) use of antibiotics: Secondary | ICD-10-CM | POA: Diagnosis not present

## 2022-09-13 DIAGNOSIS — E103553 Type 1 diabetes mellitus with stable proliferative diabetic retinopathy, bilateral: Secondary | ICD-10-CM | POA: Diagnosis not present

## 2022-09-13 DIAGNOSIS — H2701 Aphakia, right eye: Secondary | ICD-10-CM | POA: Diagnosis not present

## 2022-09-13 DIAGNOSIS — E103591 Type 1 diabetes mellitus with proliferative diabetic retinopathy without macular edema, right eye: Secondary | ICD-10-CM | POA: Diagnosis not present

## 2022-09-15 DIAGNOSIS — Z5181 Encounter for therapeutic drug level monitoring: Secondary | ICD-10-CM | POA: Diagnosis not present

## 2022-09-15 DIAGNOSIS — Z4822 Encounter for aftercare following kidney transplant: Secondary | ICD-10-CM | POA: Diagnosis not present

## 2022-09-15 DIAGNOSIS — Z79621 Long term (current) use of calcineurin inhibitor: Secondary | ICD-10-CM | POA: Diagnosis not present

## 2022-09-29 DIAGNOSIS — Z48288 Encounter for aftercare following multiple organ transplant: Secondary | ICD-10-CM | POA: Diagnosis not present

## 2022-09-29 DIAGNOSIS — K651 Peritoneal abscess: Secondary | ICD-10-CM | POA: Diagnosis not present

## 2022-09-29 DIAGNOSIS — D849 Immunodeficiency, unspecified: Secondary | ICD-10-CM | POA: Diagnosis not present

## 2022-09-29 DIAGNOSIS — Z79624 Long term (current) use of inhibitors of nucleotide synthesis: Secondary | ICD-10-CM | POA: Diagnosis not present

## 2022-09-29 DIAGNOSIS — Z7952 Long term (current) use of systemic steroids: Secondary | ICD-10-CM | POA: Diagnosis not present

## 2022-09-29 DIAGNOSIS — I951 Orthostatic hypotension: Secondary | ICD-10-CM | POA: Diagnosis not present

## 2022-09-29 DIAGNOSIS — Z940 Kidney transplant status: Secondary | ICD-10-CM | POA: Diagnosis not present

## 2022-09-29 DIAGNOSIS — Z79621 Long term (current) use of calcineurin inhibitor: Secondary | ICD-10-CM | POA: Diagnosis not present

## 2022-09-29 DIAGNOSIS — R0981 Nasal congestion: Secondary | ICD-10-CM | POA: Diagnosis not present

## 2022-09-29 DIAGNOSIS — B259 Cytomegaloviral disease, unspecified: Secondary | ICD-10-CM | POA: Diagnosis not present

## 2022-09-29 DIAGNOSIS — Z9483 Pancreas transplant status: Secondary | ICD-10-CM | POA: Diagnosis not present

## 2022-09-29 DIAGNOSIS — Z5181 Encounter for therapeutic drug level monitoring: Secondary | ICD-10-CM | POA: Diagnosis not present

## 2022-09-29 DIAGNOSIS — R051 Acute cough: Secondary | ICD-10-CM | POA: Diagnosis not present

## 2022-10-13 DIAGNOSIS — E103553 Type 1 diabetes mellitus with stable proliferative diabetic retinopathy, bilateral: Secondary | ICD-10-CM | POA: Diagnosis not present

## 2022-10-13 DIAGNOSIS — E103591 Type 1 diabetes mellitus with proliferative diabetic retinopathy without macular edema, right eye: Secondary | ICD-10-CM | POA: Diagnosis not present

## 2022-10-28 DIAGNOSIS — Z79621 Long term (current) use of calcineurin inhibitor: Secondary | ICD-10-CM | POA: Diagnosis not present

## 2022-10-28 DIAGNOSIS — B259 Cytomegaloviral disease, unspecified: Secondary | ICD-10-CM | POA: Diagnosis not present

## 2022-10-28 DIAGNOSIS — D72819 Decreased white blood cell count, unspecified: Secondary | ICD-10-CM | POA: Diagnosis not present

## 2022-10-28 DIAGNOSIS — Z79899 Other long term (current) drug therapy: Secondary | ICD-10-CM | POA: Diagnosis not present

## 2022-10-28 DIAGNOSIS — I951 Orthostatic hypotension: Secondary | ICD-10-CM | POA: Diagnosis not present

## 2022-10-28 DIAGNOSIS — Z940 Kidney transplant status: Secondary | ICD-10-CM | POA: Diagnosis not present

## 2022-10-28 DIAGNOSIS — Z9483 Pancreas transplant status: Secondary | ICD-10-CM | POA: Diagnosis not present

## 2022-11-03 DIAGNOSIS — E113511 Type 2 diabetes mellitus with proliferative diabetic retinopathy with macular edema, right eye: Secondary | ICD-10-CM | POA: Diagnosis not present

## 2022-11-03 DIAGNOSIS — E103511 Type 1 diabetes mellitus with proliferative diabetic retinopathy with macular edema, right eye: Secondary | ICD-10-CM | POA: Diagnosis not present

## 2022-11-03 DIAGNOSIS — H2701 Aphakia, right eye: Secondary | ICD-10-CM | POA: Diagnosis not present

## 2022-11-03 DIAGNOSIS — H21561 Pupillary abnormality, right eye: Secondary | ICD-10-CM | POA: Diagnosis not present

## 2022-11-10 DIAGNOSIS — H4311 Vitreous hemorrhage, right eye: Secondary | ICD-10-CM | POA: Diagnosis not present

## 2022-11-15 DIAGNOSIS — E103511 Type 1 diabetes mellitus with proliferative diabetic retinopathy with macular edema, right eye: Secondary | ICD-10-CM | POA: Diagnosis not present

## 2022-11-15 DIAGNOSIS — E103552 Type 1 diabetes mellitus with stable proliferative diabetic retinopathy, left eye: Secondary | ICD-10-CM | POA: Diagnosis not present

## 2022-11-15 DIAGNOSIS — H4311 Vitreous hemorrhage, right eye: Secondary | ICD-10-CM | POA: Diagnosis not present

## 2022-11-15 DIAGNOSIS — E103551 Type 1 diabetes mellitus with stable proliferative diabetic retinopathy, right eye: Secondary | ICD-10-CM | POA: Diagnosis not present

## 2022-11-24 DIAGNOSIS — Z79899 Other long term (current) drug therapy: Secondary | ICD-10-CM | POA: Diagnosis not present

## 2022-11-24 DIAGNOSIS — Z48288 Encounter for aftercare following multiple organ transplant: Secondary | ICD-10-CM | POA: Diagnosis not present

## 2022-12-06 DIAGNOSIS — H2701 Aphakia, right eye: Secondary | ICD-10-CM | POA: Diagnosis not present

## 2022-12-06 DIAGNOSIS — E103552 Type 1 diabetes mellitus with stable proliferative diabetic retinopathy, left eye: Secondary | ICD-10-CM | POA: Diagnosis not present

## 2022-12-06 DIAGNOSIS — E103551 Type 1 diabetes mellitus with stable proliferative diabetic retinopathy, right eye: Secondary | ICD-10-CM | POA: Diagnosis not present

## 2022-12-06 DIAGNOSIS — H21531 Iridodialysis, right eye: Secondary | ICD-10-CM | POA: Diagnosis not present

## 2022-12-06 DIAGNOSIS — H5711 Ocular pain, right eye: Secondary | ICD-10-CM | POA: Diagnosis not present

## 2022-12-06 DIAGNOSIS — E103591 Type 1 diabetes mellitus with proliferative diabetic retinopathy without macular edema, right eye: Secondary | ICD-10-CM | POA: Diagnosis not present

## 2022-12-29 DIAGNOSIS — Z79621 Long term (current) use of calcineurin inhibitor: Secondary | ICD-10-CM | POA: Diagnosis not present

## 2022-12-29 DIAGNOSIS — Z9483 Pancreas transplant status: Secondary | ICD-10-CM | POA: Diagnosis not present

## 2022-12-29 DIAGNOSIS — E785 Hyperlipidemia, unspecified: Secondary | ICD-10-CM | POA: Diagnosis not present

## 2022-12-29 DIAGNOSIS — D849 Immunodeficiency, unspecified: Secondary | ICD-10-CM | POA: Diagnosis not present

## 2022-12-29 DIAGNOSIS — Z5181 Encounter for therapeutic drug level monitoring: Secondary | ICD-10-CM | POA: Diagnosis not present

## 2022-12-29 DIAGNOSIS — Z48288 Encounter for aftercare following multiple organ transplant: Secondary | ICD-10-CM | POA: Diagnosis not present

## 2022-12-29 DIAGNOSIS — I951 Orthostatic hypotension: Secondary | ICD-10-CM | POA: Diagnosis not present

## 2022-12-29 DIAGNOSIS — Z940 Kidney transplant status: Secondary | ICD-10-CM | POA: Diagnosis not present

## 2023-01-11 DIAGNOSIS — E785 Hyperlipidemia, unspecified: Secondary | ICD-10-CM | POA: Diagnosis not present

## 2023-01-11 DIAGNOSIS — Z13220 Encounter for screening for lipoid disorders: Secondary | ICD-10-CM | POA: Diagnosis not present

## 2023-01-24 DIAGNOSIS — E103552 Type 1 diabetes mellitus with stable proliferative diabetic retinopathy, left eye: Secondary | ICD-10-CM | POA: Diagnosis not present

## 2023-01-24 DIAGNOSIS — H5711 Ocular pain, right eye: Secondary | ICD-10-CM | POA: Diagnosis not present

## 2023-01-24 DIAGNOSIS — H40023 Open angle with borderline findings, high risk, bilateral: Secondary | ICD-10-CM | POA: Diagnosis not present

## 2023-01-24 DIAGNOSIS — E103591 Type 1 diabetes mellitus with proliferative diabetic retinopathy without macular edema, right eye: Secondary | ICD-10-CM | POA: Diagnosis not present

## 2023-01-26 DIAGNOSIS — H6122 Impacted cerumen, left ear: Secondary | ICD-10-CM | POA: Diagnosis not present

## 2023-01-26 DIAGNOSIS — H903 Sensorineural hearing loss, bilateral: Secondary | ICD-10-CM | POA: Diagnosis not present

## 2023-01-26 DIAGNOSIS — Z9483 Pancreas transplant status: Secondary | ICD-10-CM | POA: Diagnosis not present

## 2023-01-26 DIAGNOSIS — Z940 Kidney transplant status: Secondary | ICD-10-CM | POA: Diagnosis not present

## 2023-02-23 DIAGNOSIS — H401132 Primary open-angle glaucoma, bilateral, moderate stage: Secondary | ICD-10-CM | POA: Diagnosis not present

## 2023-03-02 DIAGNOSIS — F32A Depression, unspecified: Secondary | ICD-10-CM | POA: Diagnosis not present

## 2023-03-02 DIAGNOSIS — Z79621 Long term (current) use of calcineurin inhibitor: Secondary | ICD-10-CM | POA: Diagnosis not present

## 2023-03-02 DIAGNOSIS — F419 Anxiety disorder, unspecified: Secondary | ICD-10-CM | POA: Diagnosis not present

## 2023-03-02 DIAGNOSIS — D849 Immunodeficiency, unspecified: Secondary | ICD-10-CM | POA: Diagnosis not present

## 2023-03-02 DIAGNOSIS — Z8639 Personal history of other endocrine, nutritional and metabolic disease: Secondary | ICD-10-CM | POA: Diagnosis not present

## 2023-03-02 DIAGNOSIS — I951 Orthostatic hypotension: Secondary | ICD-10-CM | POA: Diagnosis not present

## 2023-03-02 DIAGNOSIS — Z9483 Pancreas transplant status: Secondary | ICD-10-CM | POA: Diagnosis not present

## 2023-03-02 DIAGNOSIS — Z5181 Encounter for therapeutic drug level monitoring: Secondary | ICD-10-CM | POA: Diagnosis not present

## 2023-03-02 DIAGNOSIS — Z48288 Encounter for aftercare following multiple organ transplant: Secondary | ICD-10-CM | POA: Diagnosis not present

## 2023-03-02 DIAGNOSIS — Z940 Kidney transplant status: Secondary | ICD-10-CM | POA: Diagnosis not present

## 2023-03-02 DIAGNOSIS — E1029 Type 1 diabetes mellitus with other diabetic kidney complication: Secondary | ICD-10-CM | POA: Diagnosis not present

## 2023-03-02 DIAGNOSIS — I129 Hypertensive chronic kidney disease with stage 1 through stage 4 chronic kidney disease, or unspecified chronic kidney disease: Secondary | ICD-10-CM | POA: Diagnosis not present

## 2023-03-17 DIAGNOSIS — Z940 Kidney transplant status: Secondary | ICD-10-CM | POA: Diagnosis not present

## 2023-03-17 DIAGNOSIS — Z9483 Pancreas transplant status: Secondary | ICD-10-CM | POA: Diagnosis not present

## 2023-03-17 DIAGNOSIS — I770 Arteriovenous fistula, acquired: Secondary | ICD-10-CM | POA: Diagnosis not present

## 2023-03-17 DIAGNOSIS — Z79899 Other long term (current) drug therapy: Secondary | ICD-10-CM | POA: Diagnosis not present

## 2023-03-23 DIAGNOSIS — E1121 Type 2 diabetes mellitus with diabetic nephropathy: Secondary | ICD-10-CM | POA: Diagnosis not present

## 2023-03-23 DIAGNOSIS — Z940 Kidney transplant status: Secondary | ICD-10-CM | POA: Diagnosis not present

## 2023-04-27 DIAGNOSIS — Z940 Kidney transplant status: Secondary | ICD-10-CM | POA: Diagnosis not present

## 2023-05-23 DIAGNOSIS — Z940 Kidney transplant status: Secondary | ICD-10-CM | POA: Diagnosis not present

## 2023-06-07 ENCOUNTER — Encounter (INDEPENDENT_AMBULATORY_CARE_PROVIDER_SITE_OTHER): Payer: Medicare PPO | Admitting: Ophthalmology

## 2023-06-14 ENCOUNTER — Encounter: Payer: Self-pay | Admitting: Gastroenterology

## 2023-06-22 DIAGNOSIS — Z940 Kidney transplant status: Secondary | ICD-10-CM | POA: Diagnosis not present

## 2023-07-18 DIAGNOSIS — E1121 Type 2 diabetes mellitus with diabetic nephropathy: Secondary | ICD-10-CM | POA: Diagnosis not present

## 2023-07-18 DIAGNOSIS — I770 Arteriovenous fistula, acquired: Secondary | ICD-10-CM | POA: Diagnosis not present

## 2023-07-18 DIAGNOSIS — Z940 Kidney transplant status: Secondary | ICD-10-CM | POA: Diagnosis not present

## 2023-07-18 DIAGNOSIS — Z9483 Pancreas transplant status: Secondary | ICD-10-CM | POA: Diagnosis not present

## 2023-07-18 DIAGNOSIS — N185 Chronic kidney disease, stage 5: Secondary | ICD-10-CM | POA: Diagnosis not present

## 2023-07-18 DIAGNOSIS — Z79899 Other long term (current) drug therapy: Secondary | ICD-10-CM | POA: Diagnosis not present

## 2023-07-25 DIAGNOSIS — H40023 Open angle with borderline findings, high risk, bilateral: Secondary | ICD-10-CM | POA: Diagnosis not present

## 2023-07-25 DIAGNOSIS — H5711 Ocular pain, right eye: Secondary | ICD-10-CM | POA: Diagnosis not present

## 2023-07-25 DIAGNOSIS — H21531 Iridodialysis, right eye: Secondary | ICD-10-CM | POA: Diagnosis not present

## 2023-07-25 DIAGNOSIS — E103591 Type 1 diabetes mellitus with proliferative diabetic retinopathy without macular edema, right eye: Secondary | ICD-10-CM | POA: Diagnosis not present

## 2023-07-25 DIAGNOSIS — E103553 Type 1 diabetes mellitus with stable proliferative diabetic retinopathy, bilateral: Secondary | ICD-10-CM | POA: Diagnosis not present

## 2023-07-28 ENCOUNTER — Ambulatory Visit: Payer: Medicare PPO | Admitting: Adult Health

## 2023-07-30 DIAGNOSIS — Z23 Encounter for immunization: Secondary | ICD-10-CM | POA: Diagnosis not present

## 2023-08-10 DIAGNOSIS — D225 Melanocytic nevi of trunk: Secondary | ICD-10-CM | POA: Diagnosis not present

## 2023-08-10 DIAGNOSIS — D492 Neoplasm of unspecified behavior of bone, soft tissue, and skin: Secondary | ICD-10-CM | POA: Diagnosis not present

## 2023-08-10 DIAGNOSIS — L814 Other melanin hyperpigmentation: Secondary | ICD-10-CM | POA: Diagnosis not present

## 2023-08-10 DIAGNOSIS — L821 Other seborrheic keratosis: Secondary | ICD-10-CM | POA: Diagnosis not present

## 2023-08-10 DIAGNOSIS — D045 Carcinoma in situ of skin of trunk: Secondary | ICD-10-CM | POA: Diagnosis not present

## 2023-08-17 ENCOUNTER — Encounter: Payer: Self-pay | Admitting: Internal Medicine

## 2023-08-24 DIAGNOSIS — I129 Hypertensive chronic kidney disease with stage 1 through stage 4 chronic kidney disease, or unspecified chronic kidney disease: Secondary | ICD-10-CM | POA: Diagnosis not present

## 2023-08-24 DIAGNOSIS — Z9483 Pancreas transplant status: Secondary | ICD-10-CM | POA: Diagnosis not present

## 2023-08-24 DIAGNOSIS — R051 Acute cough: Secondary | ICD-10-CM | POA: Diagnosis not present

## 2023-08-24 DIAGNOSIS — Z961 Presence of intraocular lens: Secondary | ICD-10-CM | POA: Diagnosis not present

## 2023-08-24 DIAGNOSIS — E1029 Type 1 diabetes mellitus with other diabetic kidney complication: Secondary | ICD-10-CM | POA: Diagnosis not present

## 2023-08-24 DIAGNOSIS — Z940 Kidney transplant status: Secondary | ICD-10-CM | POA: Diagnosis not present

## 2023-08-24 DIAGNOSIS — J069 Acute upper respiratory infection, unspecified: Secondary | ICD-10-CM | POA: Diagnosis not present

## 2023-08-24 DIAGNOSIS — H401132 Primary open-angle glaucoma, bilateral, moderate stage: Secondary | ICD-10-CM | POA: Diagnosis not present

## 2023-08-25 ENCOUNTER — Ambulatory Visit: Payer: Medicare PPO | Admitting: Gastroenterology

## 2023-08-25 DIAGNOSIS — Z940 Kidney transplant status: Secondary | ICD-10-CM | POA: Diagnosis not present

## 2023-08-25 DIAGNOSIS — N185 Chronic kidney disease, stage 5: Secondary | ICD-10-CM | POA: Diagnosis not present

## 2023-08-31 DIAGNOSIS — Z79899 Other long term (current) drug therapy: Secondary | ICD-10-CM | POA: Diagnosis not present

## 2023-08-31 DIAGNOSIS — M810 Age-related osteoporosis without current pathological fracture: Secondary | ICD-10-CM | POA: Diagnosis not present

## 2023-08-31 DIAGNOSIS — Z125 Encounter for screening for malignant neoplasm of prostate: Secondary | ICD-10-CM | POA: Diagnosis not present

## 2023-08-31 DIAGNOSIS — I129 Hypertensive chronic kidney disease with stage 1 through stage 4 chronic kidney disease, or unspecified chronic kidney disease: Secondary | ICD-10-CM | POA: Diagnosis not present

## 2023-08-31 DIAGNOSIS — Z794 Long term (current) use of insulin: Secondary | ICD-10-CM | POA: Diagnosis not present

## 2023-08-31 DIAGNOSIS — E1029 Type 1 diabetes mellitus with other diabetic kidney complication: Secondary | ICD-10-CM | POA: Diagnosis not present

## 2023-08-31 DIAGNOSIS — Z940 Kidney transplant status: Secondary | ICD-10-CM | POA: Diagnosis not present

## 2023-08-31 DIAGNOSIS — Z9483 Pancreas transplant status: Secondary | ICD-10-CM | POA: Diagnosis not present

## 2023-09-07 DIAGNOSIS — E1039 Type 1 diabetes mellitus with other diabetic ophthalmic complication: Secondary | ICD-10-CM | POA: Diagnosis not present

## 2023-09-07 DIAGNOSIS — Z0000 Encounter for general adult medical examination without abnormal findings: Secondary | ICD-10-CM | POA: Diagnosis not present

## 2023-09-07 DIAGNOSIS — Z794 Long term (current) use of insulin: Secondary | ICD-10-CM | POA: Diagnosis not present

## 2023-09-07 DIAGNOSIS — I129 Hypertensive chronic kidney disease with stage 1 through stage 4 chronic kidney disease, or unspecified chronic kidney disease: Secondary | ICD-10-CM | POA: Diagnosis not present

## 2023-09-07 DIAGNOSIS — E1029 Type 1 diabetes mellitus with other diabetic kidney complication: Secondary | ICD-10-CM | POA: Diagnosis not present

## 2023-09-07 DIAGNOSIS — I739 Peripheral vascular disease, unspecified: Secondary | ICD-10-CM | POA: Diagnosis not present

## 2023-09-07 DIAGNOSIS — E1040 Type 1 diabetes mellitus with diabetic neuropathy, unspecified: Secondary | ICD-10-CM | POA: Diagnosis not present

## 2023-09-07 DIAGNOSIS — E10319 Type 1 diabetes mellitus with unspecified diabetic retinopathy without macular edema: Secondary | ICD-10-CM | POA: Diagnosis not present

## 2023-09-07 DIAGNOSIS — R82998 Other abnormal findings in urine: Secondary | ICD-10-CM | POA: Diagnosis not present

## 2023-09-07 DIAGNOSIS — G629 Polyneuropathy, unspecified: Secondary | ICD-10-CM | POA: Diagnosis not present

## 2023-09-14 DIAGNOSIS — F419 Anxiety disorder, unspecified: Secondary | ICD-10-CM | POA: Diagnosis not present

## 2023-09-14 DIAGNOSIS — Z48288 Encounter for aftercare following multiple organ transplant: Secondary | ICD-10-CM | POA: Diagnosis not present

## 2023-09-14 DIAGNOSIS — Z5181 Encounter for therapeutic drug level monitoring: Secondary | ICD-10-CM | POA: Diagnosis not present

## 2023-09-14 DIAGNOSIS — Z940 Kidney transplant status: Secondary | ICD-10-CM | POA: Diagnosis not present

## 2023-09-14 DIAGNOSIS — Z9483 Pancreas transplant status: Secondary | ICD-10-CM | POA: Diagnosis not present

## 2023-09-14 DIAGNOSIS — D849 Immunodeficiency, unspecified: Secondary | ICD-10-CM | POA: Diagnosis not present

## 2023-09-14 DIAGNOSIS — Z13220 Encounter for screening for lipoid disorders: Secondary | ICD-10-CM | POA: Diagnosis not present

## 2023-09-14 DIAGNOSIS — E785 Hyperlipidemia, unspecified: Secondary | ICD-10-CM | POA: Diagnosis not present

## 2023-09-21 DIAGNOSIS — R233 Spontaneous ecchymoses: Secondary | ICD-10-CM | POA: Diagnosis not present

## 2023-09-21 DIAGNOSIS — D045 Carcinoma in situ of skin of trunk: Secondary | ICD-10-CM | POA: Diagnosis not present

## 2023-09-21 DIAGNOSIS — L2989 Other pruritus: Secondary | ICD-10-CM | POA: Diagnosis not present

## 2023-09-30 DIAGNOSIS — Z1152 Encounter for screening for COVID-19: Secondary | ICD-10-CM | POA: Diagnosis not present

## 2023-09-30 DIAGNOSIS — R0981 Nasal congestion: Secondary | ICD-10-CM | POA: Diagnosis not present

## 2023-09-30 DIAGNOSIS — Z940 Kidney transplant status: Secondary | ICD-10-CM | POA: Diagnosis not present

## 2023-09-30 DIAGNOSIS — R52 Pain, unspecified: Secondary | ICD-10-CM | POA: Diagnosis not present

## 2023-09-30 DIAGNOSIS — Z20828 Contact with and (suspected) exposure to other viral communicable diseases: Secondary | ICD-10-CM | POA: Diagnosis not present

## 2023-09-30 DIAGNOSIS — Z9483 Pancreas transplant status: Secondary | ICD-10-CM | POA: Diagnosis not present

## 2023-09-30 DIAGNOSIS — R5383 Other fatigue: Secondary | ICD-10-CM | POA: Diagnosis not present

## 2023-09-30 DIAGNOSIS — J019 Acute sinusitis, unspecified: Secondary | ICD-10-CM | POA: Diagnosis not present

## 2023-10-06 ENCOUNTER — Ambulatory Visit: Payer: Medicare PPO | Admitting: Adult Health

## 2023-10-12 ENCOUNTER — Encounter (INDEPENDENT_AMBULATORY_CARE_PROVIDER_SITE_OTHER): Payer: Self-pay

## 2023-10-12 ENCOUNTER — Ambulatory Visit (INDEPENDENT_AMBULATORY_CARE_PROVIDER_SITE_OTHER): Payer: Medicare PPO | Admitting: Otolaryngology

## 2023-10-12 VITALS — BP 166/77 | HR 54 | Ht 72.0 in | Wt 185.0 lb

## 2023-10-12 DIAGNOSIS — H6122 Impacted cerumen, left ear: Secondary | ICD-10-CM | POA: Diagnosis not present

## 2023-10-12 DIAGNOSIS — H6092 Unspecified otitis externa, left ear: Secondary | ICD-10-CM

## 2023-10-12 DIAGNOSIS — H6062 Unspecified chronic otitis externa, left ear: Secondary | ICD-10-CM | POA: Insufficient documentation

## 2023-10-12 DIAGNOSIS — H6022 Malignant otitis externa, left ear: Secondary | ICD-10-CM

## 2023-10-12 NOTE — Progress Notes (Signed)
 Patient ID: Dillon Lane, male   DOB: [birth year], 62 y.o.   MRN: [id number]  Follow up: Recurrent left otitis externa, recurrent cerumen impaction  HPI: The patient is a 63 year old male who returns today for follow up evaluation of recurrent left otitis externa and recurrent cerumen impaction. The patient has a history of poorly controlled insulin  dependent diabetes, recurrent left malignant otitis externa, and left bony ear canal erosion. He was previously treated with multiple debridement procedures. The patient is using Ciprodex drops prn and strict dry ear precautions. The patient returns today complaining of clogging sensation in his left ear.  He has a history of kidney and pancreas transplant.  No other ENT, GI, or respiratory issue noted since the last visit.   Exam: General: Communicates without difficulty, well nourished, no acute distress. Head: Normocephalic, no evidence injury, no tenderness, facial buttresses intact without stepoff. Face/sinus: No tenderness to palpation and percussion. Facial movement is normal and symmetric. Eyes: PERRL, EOMI. No scleral icterus, conjunctivae clear. Neuro: CN II exam reveals vision grossly intact. No nystagmus at any point of gaze. Auricles: Intact without lesions.  Left ear cerumen accumulation. Nose: External evaluation reveals normal support and skin without lesions. Dorsum is intact. Anterior rhinoscopy reveals pink mucosa over anterior aspect of inferior turbinates and intact septum. No purulence noted. Oral:  Oral cavity and oropharynx are intact, symmetric, without erythema or edema. Mucosa is moist without lesions. Neck: Full range of motion without pain. There is no significant lymphadenopathy. No masses palpable. Thyroid bed within normal limits to palpation. Parotid glands and submandibular glands equal bilaterally without mass. Trachea is midline. Neuro:  CN 2-12 grossly intact. Gait normal.   Procedure: Left ear cerumen  disimpaction Anesthesia: None Description: Under the operating microscope, the cerumen is carefully removed with a combination of cerumen currette, alligator forceps, and suction catheters.  After the cerumen is removed, the TMs are noted to be normal.  Erosion of the left bony ear canal is noted.  No mass, erythema, or lesions. The patient tolerated the procedure well.   Assessment  1.  Left ear cerumen impaction.  After the disimpaction procedure, the tympanic membrane's and middle ear spaces are noted to be normal. 2.  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  No acute infection is noted today.  Plan  1.  Otomicroscopy with left ear cerumen disimpaction. 2.  The physical exam findings are reviewed with the patient. 3.  The patient will return for reevaluation in 9 months.

## 2023-10-17 DIAGNOSIS — N185 Chronic kidney disease, stage 5: Secondary | ICD-10-CM | POA: Diagnosis not present

## 2023-10-17 DIAGNOSIS — I129 Hypertensive chronic kidney disease with stage 1 through stage 4 chronic kidney disease, or unspecified chronic kidney disease: Secondary | ICD-10-CM | POA: Diagnosis not present

## 2023-10-17 DIAGNOSIS — Z79899 Other long term (current) drug therapy: Secondary | ICD-10-CM | POA: Diagnosis not present

## 2023-10-17 DIAGNOSIS — Z940 Kidney transplant status: Secondary | ICD-10-CM | POA: Diagnosis not present

## 2023-10-17 DIAGNOSIS — E1022 Type 1 diabetes mellitus with diabetic chronic kidney disease: Secondary | ICD-10-CM | POA: Diagnosis not present

## 2023-10-17 DIAGNOSIS — E1121 Type 2 diabetes mellitus with diabetic nephropathy: Secondary | ICD-10-CM | POA: Diagnosis not present

## 2023-10-28 ENCOUNTER — Ambulatory Visit (AMBULATORY_SURGERY_CENTER): Payer: Medicare PPO

## 2023-10-28 VITALS — Ht 72.0 in | Wt 185.0 lb

## 2023-10-28 DIAGNOSIS — Z860100 Personal history of colon polyps, unspecified: Secondary | ICD-10-CM

## 2023-10-28 MED ORDER — SUFLAVE 178.7 G PO SOLR: 1.0000 | Freq: Once | ORAL | 0 refills | Status: AC

## 2023-10-28 NOTE — Progress Notes (Signed)

## 2023-11-01 ENCOUNTER — Other Ambulatory Visit: Payer: Self-pay

## 2023-11-01 ENCOUNTER — Telehealth: Payer: Self-pay | Admitting: Internal Medicine

## 2023-11-01 ENCOUNTER — Encounter: Payer: Self-pay | Admitting: Internal Medicine

## 2023-11-01 DIAGNOSIS — Z860100 Personal history of colon polyps, unspecified: Secondary | ICD-10-CM

## 2023-11-01 MED ORDER — SUFLAVE 178.7 G PO SOLR: 1.0000 | Freq: Once | ORAL | 0 refills | Status: AC

## 2023-11-01 NOTE — Telephone Encounter (Signed)
RTN call, VM obtained and message left that we were returning his call.

## 2023-11-01 NOTE — Telephone Encounter (Signed)
Called patient back but was unable to reach him.  Left a detailed message letting him know that the prescription was resent to the pharmacy and that [facility] pharmacy would be reaching out to him today to make arrangements for shipment of the medication.  Also printed new instructions and mailed them to the home address.

## 2023-11-01 NOTE — Telephone Encounter (Signed)
Inbound call from patient stating he has not receive prep instructions or prep medication. Patient is requesting a call back. Please advise, thank you.

## 2023-11-02 NOTE — Telephone Encounter (Signed)
Called and spoke with patient- patient reports he received a message from [place] and that his insurance approved of the medication- instructed the patient that if [facility] or his local pharmacy (if they transfer the RX to his local pharmacy) were not able to get his prep in time (meaning no later than Friday at lunch) then he could call back to the office and have his instructions revised and the [facility] prep would be available for pick up on the 2nd floor receptionist; Patient advised to call back to the office at [phone] should questions/concerns arise; Patient verbalized understanding of information/instructions;

## 2023-11-03 ENCOUNTER — Encounter: Payer: Self-pay | Admitting: Internal Medicine

## 2023-11-04 NOTE — Telephone Encounter (Signed)
PT called about prep and instructions. Advised of the note below from [facility]

## 2023-11-07 ENCOUNTER — Ambulatory Visit (AMBULATORY_SURGERY_CENTER): Payer: Medicare PPO | Admitting: Internal Medicine

## 2023-11-07 ENCOUNTER — Encounter: Payer: Self-pay | Admitting: Internal Medicine

## 2023-11-07 VITALS — BP 119/56 | HR 54 | Temp 98.6°F | Resp 11 | Ht 72.0 in | Wt 185.0 lb

## 2023-11-07 DIAGNOSIS — Z860101 Personal history of adenomatous and serrated colon polyps: Secondary | ICD-10-CM | POA: Diagnosis not present

## 2023-11-07 DIAGNOSIS — D123 Benign neoplasm of transverse colon: Secondary | ICD-10-CM

## 2023-11-07 DIAGNOSIS — N184 Chronic kidney disease, stage 4 (severe): Secondary | ICD-10-CM | POA: Diagnosis not present

## 2023-11-07 DIAGNOSIS — G4733 Obstructive sleep apnea (adult) (pediatric): Secondary | ICD-10-CM | POA: Diagnosis not present

## 2023-11-07 DIAGNOSIS — D122 Benign neoplasm of ascending colon: Secondary | ICD-10-CM

## 2023-11-07 DIAGNOSIS — Z860100 Personal history of colon polyps, unspecified: Secondary | ICD-10-CM

## 2023-11-07 DIAGNOSIS — Z1211 Encounter for screening for malignant neoplasm of colon: Secondary | ICD-10-CM

## 2023-11-07 DIAGNOSIS — D124 Benign neoplasm of descending colon: Secondary | ICD-10-CM

## 2023-11-07 DIAGNOSIS — F32A Depression, unspecified: Secondary | ICD-10-CM | POA: Diagnosis not present

## 2023-11-07 MED ORDER — SODIUM CHLORIDE 0.9 % IV SOLN: 500.0000 mL | Freq: Once | INTRAVENOUS | Status: DC

## 2023-11-07 NOTE — Progress Notes (Signed)
 To pacu, VSS. Report to [email or web address]

## 2023-11-07 NOTE — Progress Notes (Signed)
GASTROENTEROLOGY PROCEDURE H&P NOTE   Primary Care Physician: Geoffry Paradise, MD    Reason for Procedure:  History of adenomatous colon polyps  Plan:    Colonoscopy  Patient is appropriate for endoscopic procedure(s) in the ambulatory ([facility]) setting.  The nature of the procedure, as well as the risks, benefits, and alternatives were carefully and thoroughly reviewed with the patient. Ample time for discussion and questions allowed. The patient understood, was satisfied, and agreed to proceed.     HPI: Dillon Lane is a 63 y.o. male who presents for surveillance colonoscopy.  Medical history as below.  Tolerated the prep.  No recent chest pain or shortness of breath.  No abdominal pain today.  Past Medical History:  Diagnosis Date   Allergy    fall   Cancer [facility])    pt states he has never been diagnosed    Chronic kidney disease    stage 4   Depression    05-2016- no meds    Diabetes mellitus without complication (HCC)    DM type 1, on insulin pump.   Glaucoma    Glaucoma    Hyperlipidemia    Hypertension    OSA on CPAP    Osteoporosis    PONV (postoperative nausea and vomiting)    1 time got nauseated, then passed out (one of the eye surgeries)   Retinopathy    20/200 in right eye   Sleep apnea    wears cpap   Syncope and collapse    states he can cough and pass out    Past Surgical History:  Procedure Laterality Date   AV FISTULA PLACEMENT Right 11/08/2019   Procedure: Creation of RIGHT ARM Radiocephalic ARTERIOVENOUS (AV) FISTULA;  Surgeon: Maeola Harman, MD;  Location[facility] OR;  Service: Vascular;  Laterality: Right;   CATARACT EXTRACTION  1994   left eye   COLONOSCOPY     DG BARIUM SWALLOW ([facility] HX)  1994   EYE SURGERY     x3 with 9 sessions of lasers for retinopathy.   glaucoma surgery     Dr Alvino Chapel   HAND SURGERY  2 years ago   x2   POLYPECTOMY     VITRECTOMY     x3    Prior to Admission medications   Medication Sig Start Date End  Date Taking? Authorizing Provider  acetaminophen (TYLENOL) 325 MG tablet for pain 02/13/21   [provider]  amoxicillin-clavulanate (AUGMENTIN) 875-125 MG tablet 1 tablet Orally every 12 hrs for 7 days 09/30/23   [provider]  bimatoprost (LUMIGAN) 0.01 % SOLN Place 1 drop into both eyes at bedtime.    [provider]  calcium-vitamin D (OSCAL WITH D) 500-5 MG-MCG tablet Take 1 tablet by mouth.    [provider]  cetirizine (ZYRTEC) 10 MG tablet Take 10 mg by mouth daily as needed for allergies.     [provider]  fludrocortisone (FLORINEF) 0.1 MG tablet Take 0.1 mg by mouth daily. 11/01/22   [provider]  furosemide (LASIX) 40 MG tablet Take 40 mg by mouth daily. Patient not taking: Reported on 10/28/2023 09/05/19   [provider]  HUMALOG 100 UNIT/ML injection Inject 45-50 Units into the skin daily. Uses in insulin pump averages about 45-50 units per day Patient not taking: Reported on 10/12/2023 06/04/16   [provider]  Magnesium 400 MG CAPS     [provider]  Melatonin 5 MG TBDP as directed Orally  [provider]  MYCOPHENOLATE MOFETIL PO     [provider]  predniSONE 5 MG TBEC Take 5 mg by mouth 1 day or 1 dose.    [provider]  rosuvastatin (CRESTOR) 5 MG tablet Take 5 mg by mouth daily.    [provider]  sertraline (ZOLOFT) 50 MG tablet 1 tablet Orally Once a day for 90 days 12/30/21   [provider]  tacrolimus (PROGRAF) 1 MG capsule Take by mouth. 10/25/23 10/24/24  [provider]  timolol (TIMOPTIC) 0.5 % ophthalmic solution Place 1 drop into both eyes daily.  06/02/17   [provider]  Timolol Maleate PF 0.5 % SOLN Place 1 drop into both eyes daily.    [provider]    Current Outpatient Medications  Medication Sig Dispense Refill   acetaminophen (TYLENOL) 325 MG tablet for pain     amoxicillin-clavulanate  (AUGMENTIN) 875-125 MG tablet 1 tablet Orally every 12 hrs for 7 days     bimatoprost (LUMIGAN) 0.01 % SOLN Place 1 drop into both eyes at bedtime.     calcium-vitamin D (OSCAL WITH D) 500-5 MG-MCG tablet Take 1 tablet by mouth.     cetirizine (ZYRTEC) 10 MG tablet Take 10 mg by mouth daily as needed for allergies.      fludrocortisone (FLORINEF) 0.1 MG tablet Take 0.1 mg by mouth daily.     furosemide (LASIX) 40 MG tablet Take 40 mg by mouth daily. (Patient not taking: Reported on 10/28/2023)     HUMALOG 100 UNIT/ML injection Inject 45-50 Units into the skin daily. Uses in insulin pump averages about 45-50 units per day (Patient not taking: Reported on 10/12/2023)  12   Magnesium 400 MG CAPS      Melatonin 5 MG TBDP as directed Orally     MYCOPHENOLATE MOFETIL PO      predniSONE 5 MG TBEC Take 5 mg by mouth 1 day or 1 dose.     rosuvastatin (CRESTOR) 5 MG tablet Take 5 mg by mouth daily.     sertraline (ZOLOFT) 50 MG tablet 1 tablet Orally Once a day for 90 days     tacrolimus (PROGRAF) 1 MG capsule Take by mouth.     timolol (TIMOPTIC) 0.5 % ophthalmic solution Place 1 drop into both eyes daily.      Timolol Maleate PF 0.5 % SOLN Place 1 drop into both eyes daily.     Current Facility-Administered Medications  Medication Dose Route Frequency Provider Last Rate Last Admin   0.9 %  sodium chloride infusion  500 mL Intravenous Once Bana Borgmeyer, Carie Caddy, MD        Allergies as of 11/07/2023 - Review Complete 11/07/2023  Allergen Reaction Noted   Grapefruit extract Other (See Comments) 03/27/2021   Tetracycline Shortness Of Breath 01/31/2020   Tetracyclines & related Shortness Of Breath and Swelling 10/30/2012   Atorvastatin Other (See Comments) 02/17/2021   Nsaids Other (See Comments) 10/29/2019   Milk-related compounds Diarrhea 10/29/2019    Family History  Problem Relation Age of Onset   Brain cancer Mother    Prostate cancer Father    Colon polyps Brother    Colon cancer Neg Hx    Rectal  cancer Neg Hx    Stomach cancer Neg Hx    Esophageal cancer Neg Hx     Social History   Socioeconomic History   Marital status: Married    Spouse name: Not on file   Number of children:  2   Years of education: HS   Highest education level: Not on file  Occupational History   Occupation: Disability   Tobacco Use   Smoking status: Never   Smokeless tobacco: Never  Vaping Use   Vaping status: Never Used  Substance and Sexual Activity   Alcohol use: No   Drug use: No   Sexual activity: Not on file  Other Topics Concern   Not on file  Social History Narrative   Denies caffeine use    Social Drivers of Health   Financial Resource Strain: Not on file  Food Insecurity: No Food Insecurity (05/07/2022)   Hunger Vital Sign    Worried About Running Out of Food in the Last Year: Never true    Ran Out of Food in the Last Year: Never true  Transportation Needs: No Transportation Needs (05/07/2022)   PRAPARE - Administrator, Civil Service (Medical): No    Lack of Transportation (Non-Medical): No  Physical Activity: Not on file  Stress: Not on file  Social Connections: Not on file  Intimate Partner Violence: Not on file    Physical Exam: Vital signs in last 24 hours: @BP  (!) 104/44   Pulse (!) 57   Temp 98.6 F (37 C)   Ht 6' (1.829 m)   Wt 185 lb (83.9 kg)   SpO2 99%   BMI 25.09 kg/m  GEN: NAD EYE: Sclerae anicteric ENT: MMM CV: Non-tachycardic Pulm: CTA b/l GI: Soft, NT/ND NEURO:  Alert & Oriented x 3   Erick Blinks, MD [facility]  11/07/2023 9:19 AM

## 2023-11-07 NOTE — Patient Instructions (Signed)
 Educational handout provided to patient related to Polyps  Resume previous diet  Continue present medications  Awaiting pathology results  YOU HAD AN ENDOSCOPIC PROCEDURE TODAY AT THE [facility]:   Refer to the procedure report that was given to you for any specific questions about what was found during the examination.  If the procedure report does not answer your questions, please call your gastroenterologist to clarify.  If you requested that your care partner not be given the details of your procedure findings, then the procedure report has been included in a sealed envelope for you to review at your convenience later.  YOU SHOULD EXPECT: Some feelings of bloating in the abdomen. Passage of more gas than usual.  Walking can help get rid of the air that was put into your GI tract during the procedure and reduce the bloating. If you had a lower endoscopy (such as a colonoscopy or flexible sigmoidoscopy) you may notice spotting of blood in your stool or on the toilet paper. If you underwent a bowel prep for your procedure, you may not have a normal bowel movement for a few days.  Please Note:  You might notice some irritation and congestion in your nose or some drainage.  This is from the oxygen used during your procedure.  There is no need for concern and it should clear up in a day or so.  SYMPTOMS TO REPORT IMMEDIATELY:  Following lower endoscopy (colonoscopy or flexible sigmoidoscopy):  Excessive amounts of blood in the stool  Significant tenderness or worsening of abdominal pains  Swelling of the abdomen that is new, acute  Fever of 100F or higher  For urgent or emergent issues, a gastroenterologist can be reached at any hour by calling [phone]. Do not use MyChart messaging for urgent concerns.    DIET:  We do recommend a small meal at first, but then you may proceed to your regular diet.  Drink plenty of fluids but you should avoid alcoholic beverages for 24  hours.  ACTIVITY:  You should plan to take it easy for the rest of today and you should NOT DRIVE or use heavy machinery until tomorrow (because of the sedation medicines used during the test).    FOLLOW UP: Our staff will call the number listed on your records the next business day following your procedure.  We will call around 7:15- 8:00 am to check on you and address any questions or concerns that you may have regarding the information given to you following your procedure. If we do not reach you, we will leave a message.     If any biopsies were taken you will be contacted by phone or by letter within the next 1-3 weeks.  Please call us at [phone] if you have not heard about the biopsies in 3 weeks.    SIGNATURES/CONFIDENTIALITY: You and/or your care partner have signed paperwork which will be entered into your electronic medical record.  These signatures attest to the fact that that the information above on your After Visit Summary has been reviewed and is understood.  Full responsibility of the confidentiality of this discharge information lies with you and/or your care-partner.

## 2023-11-07 NOTE — Op Note (Signed)
[facility] Patient Name: Dillon Lane Procedure Date: 11/07/2023 9:25 AM MRN: [id number] Endoscopist: Beverley Fiedler , MD, [id number] Age: 63 Referring MD:  Date of Birth: 27-Mar-1961 Gender: Male Account #: [id number] Procedure:                Colonoscopy Indications:              High risk colon cancer surveillance: Personal                            history of multiple adenomas, Last colonoscopy:                            June 2019 (TA x 1), Feb 2014 (TA x 2) Medicines:                Monitored Anesthesia Care Procedure:                Pre-Anesthesia Assessment:                           - Prior to the procedure, a History and Physical                            was performed, and patient medications and                            allergies were reviewed. The patient's tolerance of                            previous anesthesia was also reviewed. The risks                            and benefits of the procedure and the sedation                            options and risks were discussed with the patient.                            All questions were answered, and informed consent                            was obtained. Prior Anticoagulants: The patient has                            taken no anticoagulant or antiplatelet agents. ASA                            Grade Assessment: III - A patient with severe                            systemic disease. After reviewing the risks and                            benefits, the patient was deemed in satisfactory  condition to undergo the procedure.                           After obtaining informed consent, the colonoscope                            was passed under direct vision. Throughout the                            procedure, the patient's blood pressure, pulse, and                            oxygen saturations were monitored continuously. The                            Olympus Scope SN: [id number]  was introduced through                            the anus and advanced to the cecum, identified by                            appendiceal orifice and ileocecal valve. The                            colonoscopy was performed without difficulty. The                            patient tolerated the procedure well. The quality                            of the bowel preparation was good. The ileocecal                            valve, appendiceal orifice, and rectum were                            photographed. Scope In: 9:38:02 AM Scope Out: 9:59:58 AM Scope Withdrawal Time: 0 hours 12 minutes 47 seconds  Total Procedure Duration: 0 hours 21 minutes 56 seconds  Findings:                 The digital rectal exam was normal.                           A 5 mm polyp was found in the ascending colon. The                            polyp was sessile. The polyp was removed with a                            cold snare. Resection and retrieval were complete.                           A 5 mm polyp was found in the transverse colon. The  polyp was sessile. The polyp was removed with a                            cold snare. Resection and retrieval were complete.                           A 7 mm polyp was found in the descending colon. The                            polyp was sessile. The polyp was removed with a                            cold snare. Resection and retrieval were complete.                           The retroflexed view of the distal rectum and anal                            verge was normal and showed no anal or rectal                            abnormalities. Complications:            No immediate complications. Estimated Blood Loss:     Estimated blood loss was minimal. Impression:               - One 5 mm polyp in the ascending colon, removed                            with a cold snare. Resected and retrieved.                           - One 5 mm polyp in  the transverse colon, removed                            with a cold snare. Resected and retrieved.                           - One 7 mm polyp in the descending colon, removed                            with a cold snare. Resected and retrieved.                           - The distal rectum and anal verge are normal on                            retroflexion view. Recommendation:           - Patient has a contact number available for                            emergencies. The signs and symptoms of potential  delayed complications were discussed with the                            patient. Return to normal activities tomorrow.                            Written discharge instructions were provided to the                            patient.                           - Resume previous diet.                           - Continue present medications.                           - Await pathology results.                           - Repeat colonoscopy is recommended for                            surveillance. The colonoscopy date will be                            determined after pathology results from today's                            exam become available for review. Beverley Fiedler, MD 11/07/2023 10:06:06 AM This report has been signed electronically.

## 2023-11-08 ENCOUNTER — Telehealth: Payer: Self-pay

## 2023-11-08 NOTE — Telephone Encounter (Signed)
  Follow up Call-     11/07/2023    8:59 AM  Call back number  Post procedure Call Back phone  # [phone]  Permission to leave phone message Yes     Patient questions:  Do you have a fever, pain , or abdominal swelling? No. Pain Score  0 *  Have you tolerated food without any problems? Yes.    Have you been able to return to your normal activities? Yes.    Do you have any questions about your discharge instructions: Diet   No. Medications  No. Follow up visit  No.  Do you have questions or concerns about your Care? No.  Actions: * If pain score is 4 or above: No action needed, pain <4.

## 2023-11-09 ENCOUNTER — Ambulatory Visit: Payer: Medicare PPO | Admitting: Adult Health

## 2023-11-09 ENCOUNTER — Encounter: Payer: Self-pay | Admitting: Internal Medicine

## 2023-11-09 DIAGNOSIS — G4733 Obstructive sleep apnea (adult) (pediatric): Secondary | ICD-10-CM | POA: Diagnosis not present

## 2023-11-09 LAB — SURGICAL PATHOLOGY

## 2023-11-16 DIAGNOSIS — Z940 Kidney transplant status: Secondary | ICD-10-CM | POA: Diagnosis not present

## 2023-11-16 DIAGNOSIS — E1022 Type 1 diabetes mellitus with diabetic chronic kidney disease: Secondary | ICD-10-CM | POA: Diagnosis not present

## 2023-11-16 DIAGNOSIS — N185 Chronic kidney disease, stage 5: Secondary | ICD-10-CM | POA: Diagnosis not present

## 2023-12-12 DIAGNOSIS — N185 Chronic kidney disease, stage 5: Secondary | ICD-10-CM | POA: Diagnosis not present

## 2023-12-12 DIAGNOSIS — Z940 Kidney transplant status: Secondary | ICD-10-CM | POA: Diagnosis not present

## 2023-12-12 DIAGNOSIS — E1022 Type 1 diabetes mellitus with diabetic chronic kidney disease: Secondary | ICD-10-CM | POA: Diagnosis not present

## 2023-12-19 DIAGNOSIS — F32A Depression, unspecified: Secondary | ICD-10-CM | POA: Diagnosis not present

## 2023-12-19 DIAGNOSIS — Z940 Kidney transplant status: Secondary | ICD-10-CM | POA: Diagnosis not present

## 2023-12-19 DIAGNOSIS — F419 Anxiety disorder, unspecified: Secondary | ICD-10-CM | POA: Diagnosis not present

## 2023-12-19 DIAGNOSIS — Z79899 Other long term (current) drug therapy: Secondary | ICD-10-CM | POA: Diagnosis not present

## 2023-12-19 DIAGNOSIS — Z9483 Pancreas transplant status: Secondary | ICD-10-CM | POA: Diagnosis not present

## 2023-12-19 DIAGNOSIS — I770 Arteriovenous fistula, acquired: Secondary | ICD-10-CM | POA: Diagnosis not present

## 2023-12-28 DIAGNOSIS — Z9483 Pancreas transplant status: Secondary | ICD-10-CM | POA: Diagnosis not present

## 2023-12-28 DIAGNOSIS — R0981 Nasal congestion: Secondary | ICD-10-CM | POA: Diagnosis not present

## 2023-12-28 DIAGNOSIS — J069 Acute upper respiratory infection, unspecified: Secondary | ICD-10-CM | POA: Diagnosis not present

## 2023-12-28 DIAGNOSIS — Z1152 Encounter for screening for COVID-19: Secondary | ICD-10-CM | POA: Diagnosis not present

## 2023-12-28 DIAGNOSIS — Z940 Kidney transplant status: Secondary | ICD-10-CM | POA: Diagnosis not present

## 2023-12-28 DIAGNOSIS — R52 Pain, unspecified: Secondary | ICD-10-CM | POA: Diagnosis not present

## 2023-12-28 DIAGNOSIS — E1029 Type 1 diabetes mellitus with other diabetic kidney complication: Secondary | ICD-10-CM | POA: Diagnosis not present

## 2023-12-28 DIAGNOSIS — I129 Hypertensive chronic kidney disease with stage 1 through stage 4 chronic kidney disease, or unspecified chronic kidney disease: Secondary | ICD-10-CM | POA: Diagnosis not present

## 2023-12-28 DIAGNOSIS — R051 Acute cough: Secondary | ICD-10-CM | POA: Diagnosis not present

## 2024-01-03 NOTE — Progress Notes (Unsigned)
 Dillon Lane

## 2024-01-04 ENCOUNTER — Encounter: Payer: Self-pay | Admitting: Adult Health

## 2024-01-04 ENCOUNTER — Ambulatory Visit: Payer: Medicare PPO | Admitting: Adult Health

## 2024-01-04 VITALS — BP 109/55 | HR 48 | Ht 72.0 in | Wt 182.8 lb

## 2024-01-04 DIAGNOSIS — G4733 Obstructive sleep apnea (adult) (pediatric): Secondary | ICD-10-CM | POA: Diagnosis not present

## 2024-01-04 NOTE — Progress Notes (Signed)
 PATIENT: Dillon Lane DOB: [birth year]  REASON FOR VISIT: follow up HISTORY FROM: patient  Chief Complaint  Patient presents with   Follow-up    Rm 19, alone . Using  cpap, changed supplies last night.      HISTORY OF PRESENT ILLNESS: Today 01/04/24:  ILEY Lane is a 63 y.o. male with a history of OSA on CPAP. Returns today for follow-up. Reports that cpap is working well. Often he may wake up and forget to put the mask back on. DL is below.      07/27/22: Dillon Lane is a 63 year male with a history of OSA on CPAP. He returns today for follow-up. Reports CPAP working well. Some nights be doesn't sleep the entire night. Other nights he takes the mask off. His DL is below.     07/28/21: Dillon Lane is a 63 year old male with a history of OSA on CPAP. Donwload is below. Reports that he is trying ot go to bed earlier. 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 Has recently been hospitalized due to infection related to transplant. On IV ABX    07/23/20: Dillon Lane is a 63 year old male with a history of OSA on CPAP. He returns today for follow-up. Download indicates that he used machine 24/30 days for compliance of 80%. Used machine >4 hours 21/30 days for compliance of 70%. Residual AHI 1.5 on 14cm H20 with EPR 3. Leak in the 95th percentile is 31.5 %. Overall doing well with CPAP. On transplant list for pancreas and kidney. Itching at night which keeps him awake some nights- seeing dermatology.   HISTORY 07/24/19:   Dillon Lane is a 63 year old male with a history of obstructive sleep apnea on CPAP.  His download indicates that he uses machine 24 out of 30 days for compliance of 80%.  He uses machine greater than 4 hours 19 days for compliance of 63%.  On average he uses his machine 5 hours and 50 minutes.  His residual AHI is 1.7 on 14 cm of water with EPR of 3.  His leak in the 95th percentile is 25.2 L/min.  He currently has the nasal pillows.  He does not feel his mask leaking  at night.  He does not have a set bedtime routine.  He reports that he has been diagnosed with stage IV kidney disease.  Dillon Lane  He returns today for an evaluation.  REVIEW OF SYSTEMS: Out of a complete 14 system review of symptoms, the patient complains only of the following symptoms, and all other reviewed systems are negative.  ESS 6   ALLERGIES: Allergies  Allergen Reactions   Grapefruit Extract Other (See Comments)    Can't have because of the certain medications that he's taking   Tetracycline Shortness Of Breath    "Swelling"   Tetracyclines & Related Shortness Of Breath and Swelling   Atorvastatin Other (See Comments)    Diarrhea, nausea   Diarrhea, nausea   Nsaids Other (See Comments)    Due to poor kidney function   Milk-Related Compounds Diarrhea    Bloating, cramping    HOME MEDICATIONS: Outpatient Medications Prior to Visit  Medication Sig Dispense Refill   acetaminophen (TYLENOL) 325 MG tablet Take 325 mg by mouth every 4 (four) hours as needed. for pain     bimatoprost (LUMIGAN) 0.01 % SOLN Place 1 drop into both eyes at bedtime.     cetirizine (ZYRTEC) 10 MG tablet Take 10 mg  by mouth daily as needed for allergies.      Cholecalciferol (VITAMIN D3) 50 MCG (2000 UT) CAPS Take by mouth daily.     fludrocortisone (FLORINEF) 0.1 MG tablet Take 0.1 mg by mouth daily.     MAGNESIUM GLUCONATE PO Take 400 mg by mouth daily.     Melatonin 5 MG TBDP prn     mycophenolate (MYFORTIC) 180 MG EC tablet Take 360 mg by mouth 2 (two) times daily.     predniSONE 5 MG TBEC Take 5 mg by mouth daily.     rosuvastatin (CRESTOR) 5 MG tablet Take 5 mg by mouth daily.     sertraline (ZOLOFT) 50 MG tablet 1 tablet Orally Once a day for 90 days     tacrolimus (PROGRAF) 1 MG capsule Take 1 mg by mouth 2 (two) times daily.     tamsulosin (FLOMAX) 0.4 MG CAPS capsule Take 0.4 mg by mouth daily after supper.     Timolol Maleate PF 0.5 % SOLN Place 1 drop into both eyes daily.     timolol  (TIMOPTIC) 0.5 % ophthalmic solution Place 1 drop into both eyes daily.  (Patient not taking: Reported on 01/04/2024)     amoxicillin-clavulanate (AUGMENTIN) 875-125 MG tablet 1 tablet Orally every 12 hrs for 7 days     calcium-vitamin D (OSCAL WITH D) 500-5 MG-MCG tablet Take 1 tablet by mouth daily with breakfast.     furosemide (LASIX) 40 MG tablet Take 40 mg by mouth daily. (Patient not taking: Reported on 10/28/2023)     HUMALOG 100 UNIT/ML injection Inject 45-50 Units into the skin daily. Uses in insulin pump averages about 45-50 units per day (Patient not taking: Reported on 10/12/2023)  12   Magnesium 400 MG CAPS      MYCOPHENOLATE MOFETIL PO      No facility-administered medications prior to visit.    PAST MEDICAL HISTORY: Past Medical History:  Diagnosis Date   Allergy    fall   Cancer [facility])    pt states he has never been diagnosed    Chronic kidney disease    stage 4, kidney transplant 2022   Depression    05-2016- no meds    Diabetes mellitus without complication (HCC)    DM type 1, on insulin pump, no longer on pump d/t transplant   Glaucoma    Glaucoma    Hyperlipidemia    Hypertension    OSA on CPAP    Osteoporosis    PONV (postoperative nausea and vomiting)    1 time got nauseated, then passed out (one of the eye surgeries)   Retinopathy    20/200 in right eye   Sleep apnea    wears cpap   Syncope and collapse    states he can cough and pass out    PAST SURGICAL HISTORY: Past Surgical History:  Procedure Laterality Date   AV FISTULA PLACEMENT Right 11/08/2019   Procedure: Creation of RIGHT ARM Radiocephalic ARTERIOVENOUS (AV) FISTULA;  Surgeon: Maeola Harman, MD;  Location: [facility] OR;  Service: Vascular;  Laterality: Right;   CATARACT EXTRACTION  1994   left eye   COLONOSCOPY     COMBINED KIDNEY-PANCREAS TRANSPLANT  02/2021   at [facility]   DG BARIUM SWALLOW ([facility] HX)  1994   EYE SURGERY     x3 with 9 sessions of lasers for retinopathy.   glaucoma  surgery     Dr Alvino Chapel   HAND SURGERY  2 years ago  x2   POLYPECTOMY     VITRECTOMY     x3    FAMILY HISTORY: Family History  Problem Relation Age of Onset   Brain cancer Mother    Prostate cancer Father    Colon polyps Brother    Colon cancer Neg Hx    Rectal cancer Neg Hx    Stomach cancer Neg Hx    Esophageal cancer Neg Hx     SOCIAL HISTORY: Social History   Socioeconomic History   Marital status: Married    Spouse name: Not on file   Number of children: 2   Years of education: HS   Highest education level: Not on file  Occupational History   Occupation: Disability   Tobacco Use   Smoking status: Never   Smokeless tobacco: Never  Vaping Use   Vaping status: Never Used  Substance and Sexual Activity   Alcohol use: No   Drug use: No   Sexual activity: Not on file  Other Topics Concern   Not on file  Social History Narrative   Denies caffeine use    Social Drivers of Corporate investment banker Strain: Not on file  Food Insecurity: No Food Insecurity (05/07/2022)   Hunger Vital Sign    Worried About Running Out of Food in the Last Year: Never true    Ran Out of Food in the Last Year: Never true  Transportation Needs: No Transportation Needs (05/07/2022)   PRAPARE - Administrator, Civil Service (Medical): No    Lack of Transportation (Non-Medical): No  Physical Activity: Not on file  Stress: Not on file  Social Connections: Not on file  Intimate Partner Violence: Not on file      PHYSICAL EXAM Generalized: Well developed, in no acute distress  Lungs: Clear to auscultation  Neurological examination  Mentation: Alert oriented to time, place, history taking. Follows all commands speech and language fluent Cranial nerve II-XII: Facial symmetry noted.   DIAGNOSTIC DATA (LABS, IMAGING, TESTING) - I reviewed patient records, labs, notes, testing and imaging myself where available.  Lab Results  Component Value Date   HGB 12.9 (L)  11/08/2019   HCT 38.0 (L) 11/08/2019      Component Value Date/Time   NA 131 (L) 11/08/2019 0933   K 4.2 11/08/2019 0933   CL 95 (L) 11/08/2019 0933   GLUCOSE 167 (H) 11/08/2019 0933   BUN 54 (H) 11/08/2019 0933   CREATININE 4.90 (H) 11/08/2019 0933      ASSESSMENT AND PLAN 63 y.o. year old male  has a past medical history of Allergy, Cancer (HCC), Chronic kidney disease, Depression, Diabetes mellitus without complication (HCC), Glaucoma, Glaucoma, Hyperlipidemia, Hypertension, OSA on CPAP, Osteoporosis, PONV (postoperative nausea and vomiting), Retinopathy, Sleep apnea, and Syncope and collapse. here with:  OSA on CPAP  CPAP compliance suboptimal  Residual AHI is good Encouraged patient to continue using CPAP nightly and > 4 hours each night F/U in 1 year or sooner if needed   Butch Penny, MSN, NP-C 01/04/2024, 8:42 AM [facility] [address] [place], [place] [address] [phone]

## 2024-01-04 NOTE — Patient Instructions (Addendum)
 Continue using CPAP nightly and greater than 4 hours each night Try to put mask back on if you take it off.  If your symptoms worsen or you develop new symptoms please let us know.

## 2024-01-18 DIAGNOSIS — R079 Chest pain, unspecified: Secondary | ICD-10-CM | POA: Diagnosis not present

## 2024-01-30 DIAGNOSIS — N185 Chronic kidney disease, stage 5: Secondary | ICD-10-CM | POA: Diagnosis not present

## 2024-01-30 DIAGNOSIS — Z940 Kidney transplant status: Secondary | ICD-10-CM | POA: Diagnosis not present

## 2024-02-08 DIAGNOSIS — L821 Other seborrheic keratosis: Secondary | ICD-10-CM | POA: Diagnosis not present

## 2024-02-08 DIAGNOSIS — Z85828 Personal history of other malignant neoplasm of skin: Secondary | ICD-10-CM | POA: Diagnosis not present

## 2024-02-08 DIAGNOSIS — L814 Other melanin hyperpigmentation: Secondary | ICD-10-CM | POA: Diagnosis not present

## 2024-02-08 DIAGNOSIS — D225 Melanocytic nevi of trunk: Secondary | ICD-10-CM | POA: Diagnosis not present

## 2024-02-08 DIAGNOSIS — Z08 Encounter for follow-up examination after completed treatment for malignant neoplasm: Secondary | ICD-10-CM | POA: Diagnosis not present

## 2024-02-11 DIAGNOSIS — M79672 Pain in left foot: Secondary | ICD-10-CM | POA: Diagnosis not present

## 2024-02-14 DIAGNOSIS — G4733 Obstructive sleep apnea (adult) (pediatric): Secondary | ICD-10-CM | POA: Diagnosis not present

## 2024-02-22 DIAGNOSIS — Z961 Presence of intraocular lens: Secondary | ICD-10-CM | POA: Diagnosis not present

## 2024-02-22 DIAGNOSIS — H5203 Hypermetropia, bilateral: Secondary | ICD-10-CM | POA: Diagnosis not present

## 2024-02-22 DIAGNOSIS — H401131 Primary open-angle glaucoma, bilateral, mild stage: Secondary | ICD-10-CM | POA: Diagnosis not present

## 2024-02-24 DIAGNOSIS — M79672 Pain in left foot: Secondary | ICD-10-CM | POA: Diagnosis not present

## 2024-02-29 DIAGNOSIS — Z940 Kidney transplant status: Secondary | ICD-10-CM | POA: Diagnosis not present

## 2024-02-29 DIAGNOSIS — N185 Chronic kidney disease, stage 5: Secondary | ICD-10-CM | POA: Diagnosis not present

## 2024-03-07 DIAGNOSIS — I120 Hypertensive chronic kidney disease with stage 5 chronic kidney disease or end stage renal disease: Secondary | ICD-10-CM | POA: Diagnosis not present

## 2024-03-07 DIAGNOSIS — S92515A Nondisplaced fracture of proximal phalanx of left lesser toe(s), initial encounter for closed fracture: Secondary | ICD-10-CM | POA: Diagnosis not present

## 2024-03-07 DIAGNOSIS — Z9483 Pancreas transplant status: Secondary | ICD-10-CM | POA: Diagnosis not present

## 2024-03-07 DIAGNOSIS — Z794 Long term (current) use of insulin: Secondary | ICD-10-CM | POA: Diagnosis not present

## 2024-03-07 DIAGNOSIS — S76312A Strain of muscle, fascia and tendon of the posterior muscle group at thigh level, left thigh, initial encounter: Secondary | ICD-10-CM | POA: Diagnosis not present

## 2024-03-07 DIAGNOSIS — Z940 Kidney transplant status: Secondary | ICD-10-CM | POA: Diagnosis not present

## 2024-03-07 DIAGNOSIS — E1029 Type 1 diabetes mellitus with other diabetic kidney complication: Secondary | ICD-10-CM | POA: Diagnosis not present

## 2024-03-15 DIAGNOSIS — H16203 Unspecified keratoconjunctivitis, bilateral: Secondary | ICD-10-CM | POA: Diagnosis not present

## 2024-03-15 DIAGNOSIS — E875 Hyperkalemia: Secondary | ICD-10-CM | POA: Diagnosis not present

## 2024-03-15 DIAGNOSIS — Z79621 Long term (current) use of calcineurin inhibitor: Secondary | ICD-10-CM | POA: Diagnosis not present

## 2024-03-15 DIAGNOSIS — Z5181 Encounter for therapeutic drug level monitoring: Secondary | ICD-10-CM | POA: Diagnosis not present

## 2024-03-15 DIAGNOSIS — Z9483 Pancreas transplant status: Secondary | ICD-10-CM | POA: Diagnosis not present

## 2024-03-15 DIAGNOSIS — Z48288 Encounter for aftercare following multiple organ transplant: Secondary | ICD-10-CM | POA: Diagnosis not present

## 2024-03-15 DIAGNOSIS — D849 Immunodeficiency, unspecified: Secondary | ICD-10-CM | POA: Diagnosis not present

## 2024-03-15 DIAGNOSIS — Z940 Kidney transplant status: Secondary | ICD-10-CM | POA: Diagnosis not present

## 2024-03-15 DIAGNOSIS — I951 Orthostatic hypotension: Secondary | ICD-10-CM | POA: Diagnosis not present

## 2024-03-16 DIAGNOSIS — J069 Acute upper respiratory infection, unspecified: Secondary | ICD-10-CM | POA: Diagnosis not present

## 2024-03-16 DIAGNOSIS — R0981 Nasal congestion: Secondary | ICD-10-CM | POA: Diagnosis not present

## 2024-03-16 DIAGNOSIS — R051 Acute cough: Secondary | ICD-10-CM | POA: Diagnosis not present

## 2024-03-16 DIAGNOSIS — E1029 Type 1 diabetes mellitus with other diabetic kidney complication: Secondary | ICD-10-CM | POA: Diagnosis not present

## 2024-03-16 DIAGNOSIS — Z940 Kidney transplant status: Secondary | ICD-10-CM | POA: Diagnosis not present

## 2024-03-16 DIAGNOSIS — I120 Hypertensive chronic kidney disease with stage 5 chronic kidney disease or end stage renal disease: Secondary | ICD-10-CM | POA: Diagnosis not present

## 2024-03-16 DIAGNOSIS — J029 Acute pharyngitis, unspecified: Secondary | ICD-10-CM | POA: Diagnosis not present

## 2024-03-16 DIAGNOSIS — Z9483 Pancreas transplant status: Secondary | ICD-10-CM | POA: Diagnosis not present

## 2024-03-27 ENCOUNTER — Telehealth (INDEPENDENT_AMBULATORY_CARE_PROVIDER_SITE_OTHER): Payer: Self-pay | Admitting: Otolaryngology

## 2024-03-27 NOTE — Telephone Encounter (Signed)
 Patient called and stated that he was playing with water guns and got water in his left ear and was c/o dull ache. Per Dr. Karis I  called in Ciprodex ear drops and made him a follow up appointment for 04/03/24.

## 2024-04-03 ENCOUNTER — Ambulatory Visit (INDEPENDENT_AMBULATORY_CARE_PROVIDER_SITE_OTHER): Admitting: Otolaryngology

## 2024-04-03 ENCOUNTER — Encounter (INDEPENDENT_AMBULATORY_CARE_PROVIDER_SITE_OTHER): Payer: Self-pay | Admitting: Otolaryngology

## 2024-04-03 VITALS — BP 144/69 | HR 56

## 2024-04-03 DIAGNOSIS — H9042 Sensorineural hearing loss, unilateral, left ear, with unrestricted hearing on the contralateral side: Secondary | ICD-10-CM | POA: Diagnosis not present

## 2024-04-03 DIAGNOSIS — H6122 Impacted cerumen, left ear: Secondary | ICD-10-CM | POA: Diagnosis not present

## 2024-04-03 DIAGNOSIS — H6092 Unspecified otitis externa, left ear: Secondary | ICD-10-CM

## 2024-04-03 DIAGNOSIS — H60332 Swimmer's ear, left ear: Secondary | ICD-10-CM

## 2024-04-04 DIAGNOSIS — Z940 Kidney transplant status: Secondary | ICD-10-CM | POA: Diagnosis not present

## 2024-04-04 DIAGNOSIS — Z9483 Pancreas transplant status: Secondary | ICD-10-CM | POA: Diagnosis not present

## 2024-04-04 DIAGNOSIS — H9042 Sensorineural hearing loss, unilateral, left ear, with unrestricted hearing on the contralateral side: Secondary | ICD-10-CM | POA: Insufficient documentation

## 2024-04-04 DIAGNOSIS — R051 Acute cough: Secondary | ICD-10-CM | POA: Diagnosis not present

## 2024-04-04 DIAGNOSIS — E1029 Type 1 diabetes mellitus with other diabetic kidney complication: Secondary | ICD-10-CM | POA: Diagnosis not present

## 2024-04-04 DIAGNOSIS — I129 Hypertensive chronic kidney disease with stage 1 through stage 4 chronic kidney disease, or unspecified chronic kidney disease: Secondary | ICD-10-CM | POA: Diagnosis not present

## 2024-04-04 DIAGNOSIS — R0981 Nasal congestion: Secondary | ICD-10-CM | POA: Diagnosis not present

## 2024-04-04 NOTE — Progress Notes (Signed)
 Patient ID: Dillon Lane, male   DOB: [birth year], 62 y.o.   MRN: [id number]  Follow-up: Recurrent left otitis externa, recurrent cerumen impaction.  HPI: The patient is a 63 year old male who presents today for his follow-up evaluation.  He has a history of recurrent left otitis externa and recurrent cerumen impaction.  The patient had another episode of acute otitis media 1 week ago.  He noted an acute increase in left otalgia.  He contacted our office, and was prescribed Ciprodex eardrops.  The left ear pain has mostly resolved.  She continues to have a clogging sensation in the left ear.  The patient has a history of kidney and pancreas transplant.  Exam: General: Communicates without difficulty, well nourished, no acute distress. Head: Normocephalic, no evidence injury, no tenderness, facial buttresses intact without stepoff. Face/sinus: No tenderness to palpation and percussion. Facial movement is normal and symmetric. Eyes: PERRL, EOMI. No scleral icterus, conjunctivae clear. Neuro: CN II exam reveals vision grossly intact. No nystagmus at any point of gaze. Auricles: Intact without lesions.  Left ear cerumen impaction. Nose: External evaluation reveals normal support and skin without lesions. Dorsum is intact. Anterior rhinoscopy reveals pink mucosa over anterior aspect of inferior turbinates and intact septum. No purulence noted. Oral:  Oral cavity and oropharynx are intact, symmetric, without erythema or edema. Mucosa is moist without lesions. Neck: Full range of motion without pain. There is no significant lymphadenopathy. No masses palpable. Thyroid bed within normal limits to palpation. Parotid glands and submandibular glands equal bilaterally without mass. Trachea is midline. Neuro:  CN 2-12 grossly intact. Gait normal.    Procedure: Left ear cerumen disimpaction Anesthesia: None Description: Under the operating microscope, the cerumen is carefully removed with a combination of cerumen  currette, alligator forceps, and suction catheters.  After the cerumen is removed, the TMs are noted to be normal.  Erosion of the left bony ear canal is noted.  No mass, erythema, or lesions. The patient tolerated the procedure well.    Assessment: 1.  Recurrent left otitis externa.  The infection was successfully treated with Ciprodex eardrops. 2.  Left ear cerumen impaction.  After the disimpaction procedure, erosion of the left bony ear canal was noted.  The acute otitis externa has resolved. 3.  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  Plan: 1.  Otomicroscopy with left ear cerumen disimpaction. 2.  The physical exam findings are reviewed with the patient. 3.  The patient may continue the use of Ciprodex eardrops as needed for any recurrent otitis externa. 4.  The patient will return for reevaluation in 6 months.

## 2024-04-23 DIAGNOSIS — H5711 Ocular pain, right eye: Secondary | ICD-10-CM | POA: Diagnosis not present

## 2024-04-23 DIAGNOSIS — H21531 Iridodialysis, right eye: Secondary | ICD-10-CM | POA: Diagnosis not present

## 2024-04-23 DIAGNOSIS — E103553 Type 1 diabetes mellitus with stable proliferative diabetic retinopathy, bilateral: Secondary | ICD-10-CM | POA: Diagnosis not present

## 2024-04-23 DIAGNOSIS — E103512 Type 1 diabetes mellitus with proliferative diabetic retinopathy with macular edema, left eye: Secondary | ICD-10-CM | POA: Diagnosis not present

## 2024-04-23 DIAGNOSIS — E103591 Type 1 diabetes mellitus with proliferative diabetic retinopathy without macular edema, right eye: Secondary | ICD-10-CM | POA: Diagnosis not present

## 2024-04-23 DIAGNOSIS — H40023 Open angle with borderline findings, high risk, bilateral: Secondary | ICD-10-CM | POA: Diagnosis not present

## 2024-05-02 DIAGNOSIS — Z940 Kidney transplant status: Secondary | ICD-10-CM | POA: Diagnosis not present

## 2024-05-02 DIAGNOSIS — N185 Chronic kidney disease, stage 5: Secondary | ICD-10-CM | POA: Diagnosis not present

## 2024-05-09 DIAGNOSIS — H18832 Recurrent erosion of cornea, left eye: Secondary | ICD-10-CM | POA: Diagnosis not present

## 2024-05-09 DIAGNOSIS — H2000 Unspecified acute and subacute iridocyclitis: Secondary | ICD-10-CM | POA: Diagnosis not present

## 2024-05-16 DIAGNOSIS — I129 Hypertensive chronic kidney disease with stage 1 through stage 4 chronic kidney disease, or unspecified chronic kidney disease: Secondary | ICD-10-CM | POA: Diagnosis not present

## 2024-05-16 DIAGNOSIS — E1022 Type 1 diabetes mellitus with diabetic chronic kidney disease: Secondary | ICD-10-CM | POA: Diagnosis not present

## 2024-05-16 DIAGNOSIS — N185 Chronic kidney disease, stage 5: Secondary | ICD-10-CM | POA: Diagnosis not present

## 2024-05-16 DIAGNOSIS — Z940 Kidney transplant status: Secondary | ICD-10-CM | POA: Diagnosis not present

## 2024-05-24 DIAGNOSIS — Z79899 Other long term (current) drug therapy: Secondary | ICD-10-CM | POA: Diagnosis not present

## 2024-05-24 DIAGNOSIS — Z9483 Pancreas transplant status: Secondary | ICD-10-CM | POA: Diagnosis not present

## 2024-05-24 DIAGNOSIS — I770 Arteriovenous fistula, acquired: Secondary | ICD-10-CM | POA: Diagnosis not present

## 2024-05-24 DIAGNOSIS — H18832 Recurrent erosion of cornea, left eye: Secondary | ICD-10-CM | POA: Diagnosis not present

## 2024-05-24 DIAGNOSIS — I951 Orthostatic hypotension: Secondary | ICD-10-CM | POA: Diagnosis not present

## 2024-05-24 DIAGNOSIS — Z940 Kidney transplant status: Secondary | ICD-10-CM | POA: Diagnosis not present

## 2024-06-06 DIAGNOSIS — H18832 Recurrent erosion of cornea, left eye: Secondary | ICD-10-CM | POA: Diagnosis not present

## 2024-06-13 DIAGNOSIS — E1022 Type 1 diabetes mellitus with diabetic chronic kidney disease: Secondary | ICD-10-CM | POA: Diagnosis not present

## 2024-07-02 DIAGNOSIS — Z940 Kidney transplant status: Secondary | ICD-10-CM | POA: Diagnosis not present

## 2024-07-02 DIAGNOSIS — N185 Chronic kidney disease, stage 5: Secondary | ICD-10-CM | POA: Diagnosis not present

## 2024-07-02 DIAGNOSIS — E1022 Type 1 diabetes mellitus with diabetic chronic kidney disease: Secondary | ICD-10-CM | POA: Diagnosis not present

## 2024-07-11 ENCOUNTER — Ambulatory Visit (INDEPENDENT_AMBULATORY_CARE_PROVIDER_SITE_OTHER): Payer: Medicare PPO | Admitting: Otolaryngology

## 2024-07-30 DIAGNOSIS — E103512 Type 1 diabetes mellitus with proliferative diabetic retinopathy with macular edema, left eye: Secondary | ICD-10-CM | POA: Diagnosis not present

## 2024-07-30 DIAGNOSIS — H21531 Iridodialysis, right eye: Secondary | ICD-10-CM | POA: Diagnosis not present

## 2024-07-30 DIAGNOSIS — N185 Chronic kidney disease, stage 5: Secondary | ICD-10-CM | POA: Diagnosis not present

## 2024-07-30 DIAGNOSIS — H5711 Ocular pain, right eye: Secondary | ICD-10-CM | POA: Diagnosis not present

## 2024-07-30 DIAGNOSIS — E103551 Type 1 diabetes mellitus with stable proliferative diabetic retinopathy, right eye: Secondary | ICD-10-CM | POA: Diagnosis not present

## 2024-07-30 DIAGNOSIS — H353114 Nonexudative age-related macular degeneration, right eye, advanced atrophic with subfoveal involvement: Secondary | ICD-10-CM | POA: Diagnosis not present

## 2024-07-30 DIAGNOSIS — Z940 Kidney transplant status: Secondary | ICD-10-CM | POA: Diagnosis not present

## 2024-07-30 DIAGNOSIS — H40023 Open angle with borderline findings, high risk, bilateral: Secondary | ICD-10-CM | POA: Diagnosis not present

## 2024-07-30 DIAGNOSIS — E103591 Type 1 diabetes mellitus with proliferative diabetic retinopathy without macular edema, right eye: Secondary | ICD-10-CM | POA: Diagnosis not present

## 2024-07-30 DIAGNOSIS — E103552 Type 1 diabetes mellitus with stable proliferative diabetic retinopathy, left eye: Secondary | ICD-10-CM | POA: Diagnosis not present

## 2024-08-07 DIAGNOSIS — H02051 Trichiasis without entropian right upper eyelid: Secondary | ICD-10-CM | POA: Diagnosis not present

## 2024-08-07 DIAGNOSIS — H18593 Other hereditary corneal dystrophies, bilateral: Secondary | ICD-10-CM | POA: Diagnosis not present

## 2024-08-09 DIAGNOSIS — I509 Heart failure, unspecified: Secondary | ICD-10-CM | POA: Diagnosis not present

## 2024-08-09 DIAGNOSIS — D84821 Immunodeficiency due to drugs: Secondary | ICD-10-CM | POA: Diagnosis not present

## 2024-08-09 DIAGNOSIS — E271 Primary adrenocortical insufficiency: Secondary | ICD-10-CM | POA: Diagnosis not present

## 2024-08-09 DIAGNOSIS — E1151 Type 2 diabetes mellitus with diabetic peripheral angiopathy without gangrene: Secondary | ICD-10-CM | POA: Diagnosis not present

## 2024-08-09 DIAGNOSIS — E1142 Type 2 diabetes mellitus with diabetic polyneuropathy: Secondary | ICD-10-CM | POA: Diagnosis not present

## 2024-08-09 DIAGNOSIS — E1122 Type 2 diabetes mellitus with diabetic chronic kidney disease: Secondary | ICD-10-CM | POA: Diagnosis not present

## 2024-08-09 DIAGNOSIS — E1165 Type 2 diabetes mellitus with hyperglycemia: Secondary | ICD-10-CM | POA: Diagnosis not present

## 2024-08-09 DIAGNOSIS — S76312D Strain of muscle, fascia and tendon of the posterior muscle group at thigh level, left thigh, subsequent encounter: Secondary | ICD-10-CM | POA: Diagnosis not present

## 2024-08-09 DIAGNOSIS — E113519 Type 2 diabetes mellitus with proliferative diabetic retinopathy with macular edema, unspecified eye: Secondary | ICD-10-CM | POA: Diagnosis not present

## 2024-08-09 DIAGNOSIS — E785 Hyperlipidemia, unspecified: Secondary | ICD-10-CM | POA: Diagnosis not present

## 2024-08-15 DIAGNOSIS — L308 Other specified dermatitis: Secondary | ICD-10-CM | POA: Diagnosis not present

## 2024-08-15 DIAGNOSIS — R233 Spontaneous ecchymoses: Secondary | ICD-10-CM | POA: Diagnosis not present

## 2024-08-15 DIAGNOSIS — Z85828 Personal history of other malignant neoplasm of skin: Secondary | ICD-10-CM | POA: Diagnosis not present

## 2024-08-15 DIAGNOSIS — Z08 Encounter for follow-up examination after completed treatment for malignant neoplasm: Secondary | ICD-10-CM | POA: Diagnosis not present

## 2024-08-15 DIAGNOSIS — S90511A Abrasion, right ankle, initial encounter: Secondary | ICD-10-CM | POA: Diagnosis not present

## 2024-08-17 DIAGNOSIS — S76312D Strain of muscle, fascia and tendon of the posterior muscle group at thigh level, left thigh, subsequent encounter: Secondary | ICD-10-CM | POA: Diagnosis not present

## 2024-08-21 DIAGNOSIS — S76312D Strain of muscle, fascia and tendon of the posterior muscle group at thigh level, left thigh, subsequent encounter: Secondary | ICD-10-CM | POA: Diagnosis not present

## 2024-08-22 DIAGNOSIS — Z961 Presence of intraocular lens: Secondary | ICD-10-CM | POA: Diagnosis not present

## 2024-08-22 DIAGNOSIS — H401132 Primary open-angle glaucoma, bilateral, moderate stage: Secondary | ICD-10-CM | POA: Diagnosis not present

## 2024-08-23 DIAGNOSIS — S76312D Strain of muscle, fascia and tendon of the posterior muscle group at thigh level, left thigh, subsequent encounter: Secondary | ICD-10-CM | POA: Diagnosis not present

## 2024-08-29 DIAGNOSIS — S76312D Strain of muscle, fascia and tendon of the posterior muscle group at thigh level, left thigh, subsequent encounter: Secondary | ICD-10-CM | POA: Diagnosis not present

## 2024-08-29 DIAGNOSIS — N185 Chronic kidney disease, stage 5: Secondary | ICD-10-CM | POA: Diagnosis not present

## 2024-08-29 DIAGNOSIS — Z940 Kidney transplant status: Secondary | ICD-10-CM | POA: Diagnosis not present

## 2024-09-05 DIAGNOSIS — S76312D Strain of muscle, fascia and tendon of the posterior muscle group at thigh level, left thigh, subsequent encounter: Secondary | ICD-10-CM | POA: Diagnosis not present

## 2024-09-10 DIAGNOSIS — S76312D Strain of muscle, fascia and tendon of the posterior muscle group at thigh level, left thigh, subsequent encounter: Secondary | ICD-10-CM | POA: Diagnosis not present

## 2024-10-08 ENCOUNTER — Encounter (INDEPENDENT_AMBULATORY_CARE_PROVIDER_SITE_OTHER): Payer: Self-pay | Admitting: Otolaryngology

## 2024-10-08 ENCOUNTER — Ambulatory Visit (INDEPENDENT_AMBULATORY_CARE_PROVIDER_SITE_OTHER): Admitting: Otolaryngology

## 2024-10-08 VITALS — BP 115/57 | HR 60 | Ht 72.0 in | Wt 185.0 lb

## 2024-10-08 DIAGNOSIS — H6062 Unspecified chronic otitis externa, left ear: Secondary | ICD-10-CM

## 2024-10-08 DIAGNOSIS — H6122 Impacted cerumen, left ear: Secondary | ICD-10-CM | POA: Diagnosis not present

## 2024-10-08 DIAGNOSIS — H60332 Swimmer's ear, left ear: Secondary | ICD-10-CM

## 2024-10-08 NOTE — Progress Notes (Addendum)
 Patient ID: Dillon Lane, male   DOB: 10/24/1960, 64 y.o.   MRN: [id number]  Follow up: Recurrent left otitis externa, recurrent cerumen impaction  History of Present Illness Dillon Lane is a 64 year old male with recurrent left otitis externa and cerumen impaction who presents for otolaryngology follow-up.  He has a history of recurrent left otitis externa and recurrent cerumen impaction.  He was treated with Ciprodex eardrops on multiple occasions.  He also has a history of recurrent cerumen impaction.  He denies self-removal of cerumen and has not inserted any foreign objects into his ears.  He expresses concern regarding cerumen accumulation, noting the left ear is typically more problematic than the right.  He has undergone simultaneous pancreas and kidney transplantation and is maintained on daily immunosuppressive therapy including prednisone, mycophenolate, and tacrolimus . Glycemic control is stable, with a recent hemoglobin A1c of 5.4.  Exam: General: Communicates without difficulty, well nourished, no acute distress. Head: Normocephalic, no evidence injury, no tenderness, facial buttresses intact without stepoff. Face/sinus: No tenderness to palpation and percussion. Facial movement is normal and symmetric. Eyes: PERRL, EOMI. No scleral icterus, conjunctivae clear. Neuro: CN II exam reveals vision grossly intact. No nystagmus at any point of gaze. Auricles: Intact without lesions.  Left ear cerumen impaction. Nose: External evaluation reveals normal support and skin without lesions. Dorsum is intact. Anterior rhinoscopy reveals pink mucosa over anterior aspect of inferior turbinates and intact septum. No purulence noted. Oral:  Oral cavity and oropharynx are intact, symmetric, without erythema or edema. Mucosa is moist without lesions. Neck: Full range of motion without pain. There is no significant lymphadenopathy. No masses palpable. Thyroid bed within normal limits to palpation. Parotid  glands and submandibular glands equal bilaterally without mass. Trachea is midline. Neuro:  CN 2-12 grossly intact. Gait normal.    Procedure: Left ear cerumen disimpaction Anesthesia: None Description: Under the operating microscope, the cerumen is carefully removed with a combination of cerumen currette, alligator forceps, and suction catheters.  After the cerumen is removed, the TMs are noted to be normal.  Erosion of the left bony ear canal is noted.  No mass, erythema, or lesions. The patient tolerated the procedure well.  Assessment & Plan Recurrent left otitis externa in the setting of immunosuppression - Ciprodex eardrops as needed to treat the recurrent left otitis externa.  No acute infection is noted today.  Left ear cerumen impaction Chronic recurrent left ear cerumen impaction without current infection or symptoms. No evidence of otitis externa. Immunosuppressed status increases risk for complications, but no acute issues identified. - Advised him to avoid inserting objects into the ear for self-cleaning. - Scheduled follow-up in six months.

## 2024-10-18 ENCOUNTER — Telehealth: Payer: Self-pay | Admitting: Adult Health

## 2024-10-18 NOTE — Telephone Encounter (Signed)
 Pt called stating that for the last 4-5 months his wife has noticed that when he puts his cpap machine on and starts dosing off, his body will jerk/jump without his knowledge. Pt stated that he had done this before he started the cpap machine a while ago but had stopped. And now his body is doing it again. Please advise.

## 2024-10-22 NOTE — Telephone Encounter (Signed)
 There are no sooner appts available. Added pt to wait list.

## 2025-01-03 ENCOUNTER — Ambulatory Visit: Admitting: Adult Health
